# Patient Record
Sex: Female | Born: 1970 | Race: Black or African American | Hispanic: No | Marital: Married | Smoking: Never smoker
Health system: Southern US, Community
[De-identification: ages and names within clinical notes are randomized; demographics above are authoritative.]

## PROBLEM LIST (undated history)

## (undated) DIAGNOSIS — N920 Excessive and frequent menstruation with regular cycle: Secondary | ICD-10-CM

## (undated) HISTORY — DX: Excessive and frequent menstruation with regular cycle: N92.0

---

## 1998-06-07 ENCOUNTER — Other Ambulatory Visit: Admission: RE | Admit: 1998-06-07 | Discharge: 1998-06-07 | Payer: Self-pay | Admitting: Obstetrics and Gynecology

## 1998-10-28 ENCOUNTER — Inpatient Hospital Stay (HOSPITAL_COMMUNITY): Admission: AD | Admit: 1998-10-28 | Discharge: 1998-10-28 | Payer: Self-pay | Admitting: Obstetrics and Gynecology

## 1998-11-27 ENCOUNTER — Inpatient Hospital Stay (HOSPITAL_COMMUNITY): Admission: AD | Admit: 1998-11-27 | Discharge: 1998-11-27 | Payer: Self-pay | Admitting: Obstetrics and Gynecology

## 1998-12-13 ENCOUNTER — Inpatient Hospital Stay (HOSPITAL_COMMUNITY): Admission: AD | Admit: 1998-12-13 | Discharge: 1998-12-15 | Payer: Self-pay | Admitting: Obstetrics and Gynecology

## 1998-12-17 ENCOUNTER — Encounter (HOSPITAL_COMMUNITY): Admission: RE | Admit: 1998-12-17 | Discharge: 1999-03-17 | Payer: Self-pay | Admitting: Obstetrics and Gynecology

## 1999-01-18 ENCOUNTER — Other Ambulatory Visit: Admission: RE | Admit: 1999-01-18 | Discharge: 1999-01-18 | Payer: Self-pay | Admitting: Obstetrics and Gynecology

## 2001-09-07 ENCOUNTER — Other Ambulatory Visit: Admission: RE | Admit: 2001-09-07 | Discharge: 2001-09-07 | Payer: Self-pay | Admitting: Obstetrics & Gynecology

## 2002-03-23 ENCOUNTER — Encounter: Payer: Self-pay | Admitting: Family Medicine

## 2002-03-23 ENCOUNTER — Ambulatory Visit (HOSPITAL_COMMUNITY): Admission: RE | Admit: 2002-03-23 | Discharge: 2002-03-23 | Payer: Self-pay | Admitting: Family Medicine

## 2002-11-30 ENCOUNTER — Other Ambulatory Visit: Admission: RE | Admit: 2002-11-30 | Discharge: 2002-11-30 | Payer: Self-pay | Admitting: Obstetrics & Gynecology

## 2002-12-01 ENCOUNTER — Other Ambulatory Visit: Admission: RE | Admit: 2002-12-01 | Discharge: 2002-12-01 | Payer: Self-pay | Admitting: Obstetrics & Gynecology

## 2003-07-23 ENCOUNTER — Inpatient Hospital Stay (HOSPITAL_COMMUNITY): Admission: AD | Admit: 2003-07-23 | Discharge: 2003-07-26 | Payer: Self-pay | Admitting: Obstetrics & Gynecology

## 2003-07-27 ENCOUNTER — Encounter: Admission: RE | Admit: 2003-07-27 | Discharge: 2003-08-26 | Payer: Self-pay | Admitting: Obstetrics & Gynecology

## 2004-02-29 ENCOUNTER — Ambulatory Visit: Payer: Self-pay | Admitting: Pain Medicine

## 2004-06-25 ENCOUNTER — Ambulatory Visit: Payer: Self-pay | Admitting: Pain Medicine

## 2004-06-28 ENCOUNTER — Ambulatory Visit: Payer: Self-pay | Admitting: Pain Medicine

## 2004-07-12 ENCOUNTER — Ambulatory Visit: Payer: Self-pay | Admitting: Physician Assistant

## 2004-07-26 ENCOUNTER — Ambulatory Visit: Payer: Self-pay | Admitting: Pain Medicine

## 2005-12-10 ENCOUNTER — Inpatient Hospital Stay (HOSPITAL_COMMUNITY): Admission: RE | Admit: 2005-12-10 | Discharge: 2005-12-13 | Payer: Self-pay | Admitting: Orthopedic Surgery

## 2005-12-10 ENCOUNTER — Encounter (INDEPENDENT_AMBULATORY_CARE_PROVIDER_SITE_OTHER): Payer: Self-pay | Admitting: *Deleted

## 2006-01-21 HISTORY — PX: OTHER SURGICAL HISTORY: SHX169

## 2006-10-24 ENCOUNTER — Encounter: Admission: RE | Admit: 2006-10-24 | Discharge: 2006-10-24 | Payer: Self-pay | Admitting: Emergency Medicine

## 2006-10-31 ENCOUNTER — Encounter: Admission: RE | Admit: 2006-10-31 | Discharge: 2006-10-31 | Payer: Self-pay | Admitting: Emergency Medicine

## 2010-06-08 NOTE — Op Note (Signed)
Mallory Green, Mallory Green NO.:  [id number]   MEDICAL RECORD NO.:  [id number]          PATIENT TYPE:  INP   LOCATION:  2550                         FACILITY:  [facility]   PHYSICIAN:  Nelda Severe, MD      DATE OF BIRTH:  July 07, 1970   DATE OF PROCEDURE:  12/10/2005  DATE OF DISCHARGE:                               OPERATIVE REPORT   SURGEON:  Dr. Nelda Severe.   ASSISTANT:  Lianne Cure, PA-C   PREOPERATIVE DIAGNOSIS:  Central disk herniation L5-S1.   POSTOPERATIVE DIAGNOSIS:  Central disk herniation L5-S1.   OPERATIVE PROCEDURE:  1. Anterior disk excision and decompression spinal canal.  2. Anterior interbody fusion L5-S1.  3. Insertion of Synfix cage with BMP.  4. Internal fixation L5-S1.   OPERATIVE NOTE:  The patient was placed under general endotracheal  anesthesia.  Too large bore IVs were established, one either arm.  A  pulse oximeter was on the left great toe.  The arms were folded across  the chest and padded with foam secured with tape.  The abdomen was  prepped with DuraPrep and draped in square fashion.   A left sided transverse abdominal incision in the lower abdomen was  made, judging what the trajectory would be to visualize the L5-S1 disk  space, that is somewhat below the sacral promontory.  Incision was  carried down to the anterior rectus sheath.  The rectus sheath was  incised transversely.  The medial and lateral borders of the rectus were  identified and mobilized.  The rectus was mobilized posteriorly as well.   Through the part of the incision lateral to the rectus, the  retroperitoneum was dissected to expose the common iliac vessels and the  sacral promontory.   Next we placed self-retaining retractors with Brau blades to retract the  common iliac vessels right and left and the bifurcation of the great  vessels proximally.  A Ray-Tec was packed in the presacral area.  There  is a small amount of venous bleeding on the left side,  controlled with  Gelfoam soaked in thrombin.   A rectangular annulotomy was then made with a 15 blade.  We then used  disk elevators to separate the disk from the endplates of L5 above and  S1 below.  Curettes were then used to detach the nucleus laterally.  A  Leksell rongeur was then used to remove approximately 80% of the nucleus  in one piece.   We then curetted back posteriorly using angled curettes.  Distracters  were then placed in the disk and that further curettage carried out  posteriorly.  We released the posterior annulus off the upper border of  S1 from approximately 10 o'clock through 2 o'clock and similarly  released the annulus from the lower border of the L5.  The annular  fibers remove all the way back to the posterior longitudinal ligament.  Several pieces of degenerate nucleus which had become sequestrated  behind the S1 vertebral body were removed.   Then a small footprint Synfix cage, 13.5 mm x 80 degrees trial was  inserted  and a cross-table lateral radiograph taken which showed  satisfactory distraction of the disk space and ability to seat the  trial.   The implant of the same size was chosen.   In the meantime we had opened a small package of Infuse and soaked  collagen sponge with the solution.  Collagen sponge was then packed into  the cage in the three available compartments.   Then using the squid inserter, the Syn cage was inserted into the disk  space and the disk space distracted to accept it.   We then used the special jig to make holes in the vertebral bodies above  and below and locking screws were used to secure the cage to the L5-S1  vertebral bodies, internally fixing the L5-S1 level.   The retractors were then removed sequentially and care taken to observe  for any bleeding and there was none.  The ureter was inspected and there  was no evidence of any damage to it.  There had been no sharp dissection  performed at all subsequent to  incising the anterior rectus sheath,  except for the annulotomy on the L5-S1 disk.   The cross-table lateral radiograph was taken which showed excellent  position of the cage and internal fixation device.  The anterior rectus  sheath was closed using continuous #1 Vicryl suture.  The subcutaneous  layer was closed using interrupted and continuous 2-0 Vicryl suture.  The skin was closed using undyed 3-0 Vicryl in subcuticular fashion  continuously.  The skin edges were reinforced with Steri-Strips.  A  nonadherent antibiotic ointment dressing was applied and secured with  OpSite.   The blood loss estimated at approximately 150 mL.  The patient was  placed in the bed and extubated, was apparently quite slow waking up,  she has just been transferred to recovery room at time of dictation.  In  the recovery room she is able to actively dorsiflex and plantar flex  both feet, ankles.  Her oxygen saturation in the left great toe was 100%  throughout the entire procedure.   There were no intraoperative complications.  Sponge, needle counts  correct.      Nelda Severe, MD  Electronically Signed     MT/MEDQ  D:  12/10/2005  T:  12/10/2005  Job:  [id number]

## 2010-06-08 NOTE — Discharge Summary (Signed)
Mallory Green, NETZLEY NO.:  [id number]   MEDICAL RECORD NO.:  [id number]          PATIENT TYPE:  INP   LOCATION:  3010                         FACILITY:  [facility]   PHYSICIAN:  Nelda Severe, MD      DATE OF BIRTH:  Jan 31, 1970   DATE OF ADMISSION:  12/10/2005  DATE OF DISCHARGE:  12/13/2005                               DISCHARGE SUMMARY   ADMITTING DIAGNOSIS INCLUDES:  L5-S1 lumbar central disk herniation.   DISCHARGE DIAGNOSIS:  Anterior lumbar fusion, L5-S1, by Dr. Nelda Severe.   BRIEF HISTORY OF STAY:  On December 10, 2005, she was taken to the OR by  Dr. Nelda Severe for anterior L5-S1 lumbar fusion.  The patient  tolerated this well.  She was stable after surgery.  Minimal blood loss.  She was admitted to [facility].  She was placed on PCA for pain  control.  Clear-liquid diet until flatulence was passed.  Physical  therapy was ordered for ambulation and mobility.  Postoperative day 1,  she was neurovascularly motor intact distally.  Hemoglobin was 9.2,  electrolytes were stable.  She was afebrile, and vital signs were  stable.  Minimal drainage on the dressing.  No active drainage noted.  Calves were soft.  TED hose were in place as well as SCDs.  Decreased IV  fluids 50 mL/hour at that time.  Postoperative day 2, she did have signs  of an ileus, and she had swelling in bilateral lower extremities.  We  have started her on Lovenox 40 mg subcu once daily, dose per pharmacy.  Still waiting to advance her diet.  No flatulence passed.  She was  afebrile, and vital signs were stable.  Distally neurovascular and motor  intact.  Abdomen soft.  Postoperative day 2, she was neurovascular and  motor intact.  No flatulence passed at this point.  Hemoglobin was  stable.  She was on Lovenox and DVT prophylaxis.  Postoperative day 3,  November 23rd, she did pass flatulence.  She did eat some breakfast this  morning.  IV was discontinued.  PCA was  discontinued.  Foley was  discontinued.  Dressing was changed today.  The incision is also clean,  dry, and intact.  No active drainage.  No abnormal erythema.  Distally,  she is neurovascularly and motor intact.  Calves were soft, nontender to  palpation.   PLAN AT THIS POINT:  She can walk for ambulation as tolerated.  No  dressing is necessary.  She may shower.  She is going to continue with  Lovenox 40 mg subcu once daily for 14 days.  She is going to be given a  prescription for Norco 10/325 for pain control 1 to 2 every 4-6 hours  p.r.n. for pain.  She is also going to follow up with our office in 2  weeks with Dr. Nelda Severe.   DISPOSITION:  Stable.   DIET:  Regular.   DIAGNOSIS:  Status post anterior lumbar fusion at L5-S1.      Lianne Cure, P.A.      Nelda Severe,  MD  Electronically Signed    MC/MEDQ  D:  12/13/2005  T:  12/13/2005  Job:  [id number]

## 2010-06-08 NOTE — H&P (Signed)
NAMEJESSENIA, Green NO.:  [id number]   MEDICAL RECORD NO.:  [id number]          PATIENT TYPE:  INP   LOCATION:  NA                           FACILITY:  [facility]   PHYSICIAN:  Lianne Cure, P.A.  DATE OF BIRTH:  1970/03/30   DATE OF ADMISSION:  DATE OF DISCHARGE:                                HISTORY & PHYSICAL   HISTORY OF PRESENT ILLNESS:  This is a 40 year old female that comes in with  chief complaint of lumbar back pain, centrally located.  She is otherwise  healthy.   ALLERGIES:  She has no known drug allergies.   CURRENT MEDICATIONS:  Not currently taking any prescription medications.   PAST SURGICAL HISTORY:  She does not have a past surgical history.   FAMILY HISTORY:  Includes mother, age 30, decreased secondary to breast  cancer, and father currently living with osteoarthritis.   REVIEW OF SYSTEMS:  She appears to be a well-developed, well-nourished,  normocephalic, well-appearing 40 year old female in no acute distress.  She  reports no fever, no chills, no bowel or bladder incontinence.  No chronic  cough.  No bleeding tendencies.   PHYSICAL EXAMINATION:  VITAL SIGNS:  Temperature is 98.4, pulse 66,  respirations 18, blood pressure 102/70.  HEENT:  Pupils are equal, round, and reactive to light.  NECK:  Supple.  Full range of motion, and nontender to palpation.  CHEST:  Clear to auscultation bilaterally.  No wheezes noted.  HEART:  Regular rate and rhythm.  No murmur noted.  ABDOMEN:  Soft.  Nontender to palpation.  Positive bowel sounds on  auscultation.  EXTREMITIES:  She has pain essentially in her back at the L5-S1 area that  extends into the buttocks and posterior thigh, but it does not go below the  knees.  SKIN:  Intact, clean, and dry.   X-rays shows degenerative disk disease.  MRI shows L5-S1 central disk  herniation.   PLAN:  Anterior L5-S1 lumbar fusion by Dr. Nelda Severe.      Lianne Cure, P.A.     MC/MEDQ  D:   12/09/2005  T:  12/09/2005  Job:  [id number]
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
## 2010-11-08 ENCOUNTER — Other Ambulatory Visit: Payer: Self-pay | Admitting: Occupational Medicine

## 2010-11-08 ENCOUNTER — Ambulatory Visit: Payer: Self-pay

## 2010-11-08 DIAGNOSIS — R7611 Nonspecific reaction to tuberculin skin test without active tuberculosis: Secondary | ICD-10-CM

## 2011-11-29 ENCOUNTER — Encounter: Payer: Self-pay | Admitting: Obstetrics and Gynecology

## 2011-11-29 ENCOUNTER — Ambulatory Visit (INDEPENDENT_AMBULATORY_CARE_PROVIDER_SITE_OTHER): Payer: 59 | Admitting: Obstetrics and Gynecology

## 2011-11-29 VITALS — BP 112/62 | HR 68 | Resp 16 | Ht 66.0 in | Wt 147.0 lb

## 2011-11-29 DIAGNOSIS — Z139 Encounter for screening, unspecified: Secondary | ICD-10-CM

## 2011-11-29 DIAGNOSIS — Z01419 Encounter for gynecological examination (general) (routine) without abnormal findings: Secondary | ICD-10-CM

## 2011-11-29 DIAGNOSIS — N920 Excessive and frequent menstruation with regular cycle: Secondary | ICD-10-CM | POA: Insufficient documentation

## 2011-11-29 DIAGNOSIS — Z124 Encounter for screening for malignant neoplasm of cervix: Secondary | ICD-10-CM

## 2011-11-29 LAB — PROLACTIN: Prolactin: 9.2 ng/mL

## 2011-11-29 LAB — CBC
Hemoglobin: 10.2 g/dL — ABNORMAL LOW (ref 12.0–15.0)
MCH: 26.4 pg (ref 26.0–34.0)
MCHC: 31.8 g/dL (ref 30.0–36.0)
MCV: 83.2 fL (ref 78.0–100.0)
Platelets: 317 10*3/uL (ref 150–400)
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
## 2011-12-02 LAB — PAP IG W/ RFLX HPV ASCU

## 2011-12-04 ENCOUNTER — Telehealth: Payer: Self-pay

## 2011-12-04 NOTE — Telephone Encounter (Signed)
Notified pt of labs. Rec that she take her M.Vit everyday and supplement OTC iron q d for low iron  Of 10.2.  Pt is agreeable. Appts scheduled for 12/31/2011 for u/s and f/u with Dr Su Hilt afterward. Melody Comas A

## 2011-12-04 NOTE — Telephone Encounter (Signed)
LM for pt to cb re: test results. Mallory Green, Jacqueline A  

## 2011-12-30 ENCOUNTER — Other Ambulatory Visit: Payer: Self-pay

## 2011-12-30 DIAGNOSIS — N920 Excessive and frequent menstruation with regular cycle: Secondary | ICD-10-CM

## 2011-12-31 ENCOUNTER — Ambulatory Visit (INDEPENDENT_AMBULATORY_CARE_PROVIDER_SITE_OTHER): Payer: 59

## 2011-12-31 ENCOUNTER — Encounter: Payer: Self-pay | Admitting: Obstetrics and Gynecology

## 2011-12-31 ENCOUNTER — Ambulatory Visit (INDEPENDENT_AMBULATORY_CARE_PROVIDER_SITE_OTHER): Payer: 59 | Admitting: Obstetrics and Gynecology

## 2011-12-31 VITALS — BP 120/74 | Resp 16 | Ht 66.0 in | Wt 145.0 lb

## 2011-12-31 DIAGNOSIS — N920 Excessive and frequent menstruation with regular cycle: Secondary | ICD-10-CM

## 2011-12-31 MED ORDER — TRANEXAMIC ACID 650 MG PO TABS: 1300.0000 mg | ORAL_TABLET | Freq: Three times a day (TID) | ORAL | Status: DC | PRN

## 2011-12-31 NOTE — Progress Notes (Addendum)
Here for f/u u/s and embx  Filed Vitals:   12/31/11 1522  BP: 120/74  Resp: 16   ROS: noncontributory  Pelvic exam:  VULVA: normal appearing vulva with no masses, tenderness or lesions,  VAGINA: normal appearing vagina with normal color and discharge, no lesions, CERVIX: normal appearing cervix without discharge or lesions,   EmBx Performed per protocol Pipelle passed x 3 to 9.5cm  U/S - Ut 7.75 x 5.2 x 6.1cm, nl bil ovaries, nl endometrium A/P Options reviewed Pt wants to try lysteda Rx sent rto for f/u

## 2012-01-01 ENCOUNTER — Other Ambulatory Visit: Payer: Self-pay | Admitting: Obstetrics and Gynecology

## 2012-01-01 DIAGNOSIS — N920 Excessive and frequent menstruation with regular cycle: Secondary | ICD-10-CM

## 2012-12-25 ENCOUNTER — Other Ambulatory Visit: Payer: Self-pay

## 2012-12-25 DIAGNOSIS — Z1231 Encounter for screening mammogram for malignant neoplasm of breast: Secondary | ICD-10-CM

## 2013-02-02 ENCOUNTER — Ambulatory Visit: Payer: Self-pay

## 2013-02-02 ENCOUNTER — Ambulatory Visit
Admission: RE | Admit: 2013-02-02 | Discharge: 2013-02-02 | Disposition: A | Discharge status: Home / Self Care | Payer: 59 | Source: Ambulatory Visit

## 2013-02-02 DIAGNOSIS — Z1231 Encounter for screening mammogram for malignant neoplasm of breast: Secondary | ICD-10-CM

## 2013-02-08 ENCOUNTER — Other Ambulatory Visit: Payer: Self-pay | Admitting: Obstetrics and Gynecology

## 2013-02-08 DIAGNOSIS — R928 Other abnormal and inconclusive findings on diagnostic imaging of breast: Secondary | ICD-10-CM

## 2013-02-12 ENCOUNTER — Other Ambulatory Visit: Payer: Self-pay

## 2013-02-15 ENCOUNTER — Other Ambulatory Visit: Payer: Self-pay | Admitting: Obstetrics and Gynecology

## 2013-02-15 ENCOUNTER — Ambulatory Visit
Admission: RE | Admit: 2013-02-15 | Discharge: 2013-02-15 | Disposition: A | Discharge status: Home / Self Care | Payer: 59 | Source: Ambulatory Visit | Attending: Obstetrics and Gynecology | Admitting: Obstetrics and Gynecology

## 2013-02-15 DIAGNOSIS — R928 Other abnormal and inconclusive findings on diagnostic imaging of breast: Secondary | ICD-10-CM

## 2013-11-22 ENCOUNTER — Encounter: Payer: Self-pay | Admitting: Obstetrics and Gynecology

## 2014-12-26 ENCOUNTER — Other Ambulatory Visit: Payer: Self-pay

## 2014-12-26 DIAGNOSIS — Z1231 Encounter for screening mammogram for malignant neoplasm of breast: Secondary | ICD-10-CM

## 2015-02-06 ENCOUNTER — Ambulatory Visit
Admission: RE | Admit: 2015-02-06 | Discharge: 2015-02-06 | Disposition: A | Discharge status: Home / Self Care | Payer: 59 | Source: Ambulatory Visit

## 2015-02-06 ENCOUNTER — Other Ambulatory Visit: Payer: Self-pay

## 2015-02-06 DIAGNOSIS — Z1231 Encounter for screening mammogram for malignant neoplasm of breast: Secondary | ICD-10-CM

## 2016-08-16 ENCOUNTER — Telehealth: Payer: Self-pay | Admitting: Family Medicine

## 2016-08-16 ENCOUNTER — Ambulatory Visit (INDEPENDENT_AMBULATORY_CARE_PROVIDER_SITE_OTHER): Payer: 59 | Admitting: Family Medicine

## 2016-08-16 ENCOUNTER — Encounter: Payer: Self-pay | Admitting: Family Medicine

## 2016-08-16 ENCOUNTER — Other Ambulatory Visit: Payer: Self-pay | Admitting: Family Medicine

## 2016-08-16 VITALS — BP 108/74 | HR 86 | Temp 98.6°F | Resp 20 | Ht 66.0 in | Wt 145.8 lb

## 2016-08-16 DIAGNOSIS — Z131 Encounter for screening for diabetes mellitus: Secondary | ICD-10-CM

## 2016-08-16 DIAGNOSIS — Z0000 Encounter for general adult medical examination without abnormal findings: Secondary | ICD-10-CM

## 2016-08-16 DIAGNOSIS — Z130 Encounter for screening for diseases of the blood and blood-forming organs and certain disorders involving the immune mechanism: Secondary | ICD-10-CM

## 2016-08-16 DIAGNOSIS — Z1239 Encounter for other screening for malignant neoplasm of breast: Secondary | ICD-10-CM

## 2016-08-16 DIAGNOSIS — Z1329 Encounter for screening for other suspected endocrine disorder: Secondary | ICD-10-CM | POA: Diagnosis not present

## 2016-08-16 DIAGNOSIS — Z1231 Encounter for screening mammogram for malignant neoplasm of breast: Secondary | ICD-10-CM

## 2016-08-16 DIAGNOSIS — N920 Excessive and frequent menstruation with regular cycle: Secondary | ICD-10-CM | POA: Diagnosis not present

## 2016-08-16 DIAGNOSIS — Z13220 Encounter for screening for lipoid disorders: Secondary | ICD-10-CM | POA: Diagnosis not present

## 2016-08-16 LAB — CBC WITH DIFFERENTIAL/PLATELET
BASOS ABS: 0.1 10*3/uL (ref 0.0–0.1)
Basophils Relative: 1.9 % (ref 0.0–3.0)
Eosinophils Absolute: 0 10*3/uL (ref 0.0–0.7)
Eosinophils Relative: 0.8 % (ref 0.0–5.0)
HCT: 32.9 % — ABNORMAL LOW (ref 36.0–46.0)
HEMOGLOBIN: 10.2 g/dL — AB (ref 12.0–15.0)
LYMPHS ABS: 1.4 10*3/uL (ref 0.7–4.0)
LYMPHS PCT: 33.2 % (ref 12.0–46.0)
MCHC: 31.2 g/dL (ref 30.0–36.0)
MCV: 82.9 fl (ref 78.0–100.0)
MONOS PCT: 9.2 % (ref 3.0–12.0)
Monocytes Absolute: 0.4 10*3/uL (ref 0.1–1.0)
NEUTROS PCT: 54.9 % (ref 43.0–77.0)
Neutro Abs: 2.2 10*3/uL (ref 1.4–7.7)
Platelets: 308 10*3/uL (ref 150.0–400.0)
RBC: 3.96 Mil/uL (ref 3.87–5.11)
RDW: 17.5 % — ABNORMAL HIGH (ref 11.5–15.5)
WBC: 4.1 10*3/uL (ref 4.0–10.5)

## 2016-08-16 LAB — LIPID PANEL
CHOL/HDL RATIO: 3
Cholesterol: 159 mg/dL (ref 0–200)
HDL: 49.2 mg/dL (ref >39.00)
LDL Cholesterol: 98 mg/dL (ref 0–99)
NONHDL: 109.65
Triglycerides: 58 mg/dL (ref 0.0–149.0)
VLDL: 11.6 mg/dL (ref 0.0–40.0)

## 2016-08-16 LAB — HEMOGLOBIN A1C: HEMOGLOBIN A1C: 4.9 % (ref 4.6–6.5)

## 2016-08-16 LAB — COMPREHENSIVE METABOLIC PANEL
ALBUMIN: 4.1 g/dL (ref 3.5–5.2)
ALK PHOS: 41 U/L (ref 39–117)
ALT: 12 U/L (ref 0–35)
AST: 19 U/L (ref 0–37)
BILIRUBIN TOTAL: 0.7 mg/dL (ref 0.2–1.2)
BUN: 9 mg/dL (ref 6–23)
CO2: 26 mEq/L (ref 19–32)
Calcium: 9.2 mg/dL (ref 8.4–10.5)
Chloride: 107 mEq/L (ref 96–112)
Creatinine, Ser: 1.01 mg/dL (ref 0.40–1.20)
GFR: 75.86 mL/min (ref >60.00)
GLUCOSE: 93 mg/dL (ref 70–99)
Potassium: 4.7 mEq/L (ref 3.5–5.1)
Sodium: 140 mEq/L (ref 135–145)
TOTAL PROTEIN: 7.6 g/dL (ref 6.0–8.3)

## 2016-08-16 LAB — TSH: TSH: 1.77 u[IU]/mL (ref 0.35–4.50)

## 2016-08-16 NOTE — Patient Instructions (Signed)
Health Maintenance, Female Adopting a healthy lifestyle and getting preventive care can go a long way to promote health and wellness. Talk with your health care provider about what schedule of regular examinations is right for you. This is a good chance for you to check in with your provider about disease prevention and staying healthy. In between checkups, there are plenty of things you can do on your own. Experts have done a lot of research about which lifestyle changes and preventive measures are most likely to keep you healthy. Ask your health care provider for more information. Weight and diet Eat a healthy diet  Be sure to include plenty of vegetables, fruits, low-fat dairy products, and lean protein.  Do not eat a lot of foods high in solid fats, added sugars, or salt.  Get regular exercise. This is one of the most important things you can do for your health. ? Most adults should exercise for at least 150 minutes each week. The exercise should increase your heart rate and make you sweat (moderate-intensity exercise). ? Most adults should also do strengthening exercises at least twice a week. This is in addition to the moderate-intensity exercise.  Maintain a healthy weight  Body mass index (BMI) is a measurement that can be used to identify possible weight problems. It estimates body fat based on height and weight. Your health care provider can help determine your BMI and help you achieve or maintain a healthy weight.  For females 20 years of age and older: ? A BMI below 18.5 is considered underweight. ? A BMI of 18.5 to 24.9 is normal. ? A BMI of 25 to 29.9 is considered overweight. ? A BMI of 30 and above is considered obese.  Watch levels of cholesterol and blood lipids  You should start having your blood tested for lipids and cholesterol at 46 years of age, then have this test every 5 years.  You may need to have your cholesterol levels checked more often if: ? Your lipid or  cholesterol levels are high. ? You are older than 46 years of age. ? You are at high risk for heart disease.  Cancer screening Lung Cancer  Lung cancer screening is recommended for adults 55-80 years old who are at high risk for lung cancer because of a history of smoking.  A yearly low-dose CT scan of the lungs is recommended for people who: ? Currently smoke. ? Have quit within the past 15 years. ? Have at least a 30-pack-year history of smoking. A pack year is smoking an average of one pack of cigarettes a day for 1 year.  Yearly screening should continue until it has been 15 years since you quit.  Yearly screening should stop if you develop a health problem that would prevent you from having lung cancer treatment.  Breast Cancer  Practice breast self-awareness. This means understanding how your breasts normally appear and feel.  It also means doing regular breast self-exams. Let your health care provider know about any changes, no matter how small.  If you are in your 20s or 30s, you should have a clinical breast exam (CBE) by a health care provider every 1-3 years as part of a regular health exam.  If you are 40 or older, have a CBE every year. Also consider having a breast X-ray (mammogram) every year.  If you have a family history of breast cancer, talk to your health care provider about genetic screening.  If you are at high risk   for breast cancer, talk to your health care provider about having an MRI and a mammogram every year.  Breast cancer gene (BRCA) assessment is recommended for women who have family members with BRCA-related cancers. BRCA-related cancers include: ? Breast. ? Ovarian. ? Tubal. ? Peritoneal cancers.  Results of the assessment will determine the need for genetic counseling and BRCA1 and BRCA2 testing.  Cervical Cancer Your health care provider may recommend that you be screened regularly for cancer of the pelvic organs (ovaries, uterus, and  vagina). This screening involves a pelvic examination, including checking for microscopic changes to the surface of your cervix (Pap test). You may be encouraged to have this screening done every 3 years, beginning at age 22.  For women ages 56-65, health care providers may recommend pelvic exams and Pap testing every 3 years, or they may recommend the Pap and pelvic exam, combined with testing for human papilloma virus (HPV), every 5 years. Some types of HPV increase your risk of cervical cancer. Testing for HPV may also be done on women of any age with unclear Pap test results.  Other health care providers may not recommend any screening for nonpregnant women who are considered low risk for pelvic cancer and who do not have symptoms. Ask your health care provider if a screening pelvic exam is right for you.  If you have had past treatment for cervical cancer or a condition that could lead to cancer, you need Pap tests and screening for cancer for at least 20 years after your treatment. If Pap tests have been discontinued, your risk factors (such as having a new sexual partner) need to be reassessed to determine if screening should resume. Some women have medical problems that increase the chance of getting cervical cancer. In these cases, your health care provider may recommend more frequent screening and Pap tests.  Colorectal Cancer  This type of cancer can be detected and often prevented.  Routine colorectal cancer screening usually begins at 46 years of age and continues through 46 years of age.  Your health care provider may recommend screening at an earlier age if you have risk factors for colon cancer.  Your health care provider may also recommend using home test kits to check for hidden blood in the stool.  A small camera at the end of a tube can be used to examine your colon directly (sigmoidoscopy or colonoscopy). This is done to check for the earliest forms of colorectal  cancer.  Routine screening usually begins at age 33.  Direct examination of the colon should be repeated every 5-10 years through 46 years of age. However, you may need to be screened more often if early forms of precancerous polyps or small growths are found.  Skin Cancer  Check your skin from head to toe regularly.  Tell your health care provider about any new moles or changes in moles, especially if there is a change in a mole's shape or color.  Also tell your health care provider if you have a mole that is larger than the size of a pencil eraser.  Always use sunscreen. Apply sunscreen liberally and repeatedly throughout the day.  Protect yourself by wearing long sleeves, pants, a wide-brimmed hat, and sunglasses whenever you are outside.  Heart disease, diabetes, and high blood pressure  High blood pressure causes heart disease and increases the risk of stroke. High blood pressure is more likely to develop in: ? People who have blood pressure in the high end of  the normal range (130-139/85-89 mm Hg). ? People who are overweight or obese. ? People who are African American.  If you are 21-29 years of age, have your blood pressure checked every 3-5 years. If you are 3 years of age or older, have your blood pressure checked every year. You should have your blood pressure measured twice-once when you are at a hospital or clinic, and once when you are not at a hospital or clinic. Record the average of the two measurements. To check your blood pressure when you are not at a hospital or clinic, you can use: ? An automated blood pressure machine at a pharmacy. ? A home blood pressure monitor.  If you are between 17 years and 37 years old, ask your health care provider if you should take aspirin to prevent strokes.  Have regular diabetes screenings. This involves taking a blood sample to check your fasting blood sugar level. ? If you are at a normal weight and have a low risk for diabetes,  have this test once every three years after 46 years of age. ? If you are overweight and have a high risk for diabetes, consider being tested at a younger age or more often. Preventing infection Hepatitis B  If you have a higher risk for hepatitis B, you should be screened for this virus. You are considered at high risk for hepatitis B if: ? You were born in a country where hepatitis B is common. Ask your health care provider which countries are considered high risk. ? Your parents were born in a high-risk country, and you have not been immunized against hepatitis B (hepatitis B vaccine). ? You have HIV or AIDS. ? You use needles to inject street drugs. ? You live with someone who has hepatitis B. ? You have had sex with someone who has hepatitis B. ? You get hemodialysis treatment. ? You take certain medicines for conditions, including cancer, organ transplantation, and autoimmune conditions.  Hepatitis C  Blood testing is recommended for: ? Everyone born from [birth year] through 1965. ? Anyone with known risk factors for hepatitis C.  Sexually transmitted infections (STIs)  You should be screened for sexually transmitted infections (STIs) including gonorrhea and chlamydia if: ? You are sexually active and are younger than 46 years of age. ? You are older than 46 years of age and your health care provider tells you that you are at risk for this type of infection. ? Your sexual activity has changed since you were last screened and you are at an increased risk for chlamydia or gonorrhea. Ask your health care provider if you are at risk.  If you do not have HIV, but are at risk, it may be recommended that you take a prescription medicine daily to prevent HIV infection. This is called pre-exposure prophylaxis (PrEP). You are considered at risk if: ? You are sexually active and do not regularly use condoms or know the HIV status of your partner(s). ? You take drugs by injection. ? You are  sexually active with a partner who has HIV.  Talk with your health care provider about whether you are at high risk of being infected with HIV. If you choose to begin PrEP, you should first be tested for HIV. You should then be tested every 3 months for as long as you are taking PrEP. Pregnancy  If you are premenopausal and you may become pregnant, ask your health care provider about preconception counseling.  If you may become  pregnant, take 400 to 800 micrograms (mcg) of folic acid every day.  If you want to prevent pregnancy, talk to your health care provider about birth control (contraception). Osteoporosis and menopause  Osteoporosis is a disease in which the bones lose minerals and strength with aging. This can result in serious bone fractures. Your risk for osteoporosis can be identified using a bone density scan.  If you are 65 years of age or older, or if you are at risk for osteoporosis and fractures, ask your health care provider if you should be screened.  Ask your health care provider whether you should take a calcium or vitamin D supplement to lower your risk for osteoporosis.  Menopause may have certain physical symptoms and risks.  Hormone replacement therapy may reduce some of these symptoms and risks. Talk to your health care provider about whether hormone replacement therapy is right for you. Follow these instructions at home:  Schedule regular health, dental, and eye exams.  Stay current with your immunizations.  Do not use any tobacco products including cigarettes, chewing tobacco, or electronic cigarettes.  If you are pregnant, do not drink alcohol.  If you are breastfeeding, limit how much and how often you drink alcohol.  Limit alcohol intake to no more than 1 drink per day for nonpregnant women. One drink equals 12 ounces of beer, 5 ounces of wine, or 1 ounces of hard liquor.  Do not use street drugs.  Do not share needles.  Ask your health care  provider for help if you need support or information about quitting drugs.  Tell your health care provider if you often feel depressed.  Tell your health care provider if you have ever been abused or do not feel safe at home. This information is not intended to replace advice given to you by your health care provider. Make sure you discuss any questions you have with your health care provider. Document Released: 07/23/2010 Document Revised: 06/15/2015 Document Reviewed: 10/11/2014 Elsevier Interactive Patient Education  2018 [employer]   Please help us help you:  We are honored you have chosen [facility] for your Primary Care home. Below you will find basic instructions that you may need to access in the future. Please help us help you by reading the instructions, which cover many of the frequent questions we experience.   Prescription refills and request:  -In order to allow more efficient response time, please call your pharmacy for all refills. They will forward the request electronically to us. This allows for the quickest possible response. Request left on a nurse line can take longer to refill, since these are checked as time allows between office patients and other phone calls.  - refill request can take up to 3-5 working days to complete.  - If request is sent electronically and request is appropiate, it is usually completed in 1-2 business days.  - all patients will need to be seen routinely for all chronic medical conditions requiring prescription medications (see follow-up below). If you are overdue for follow up on your condition, you will be asked to make an appointment and we will call in enough medication to cover you until your appointment (up to 30 days).  - all controlled substances will require a face to face visit to request/refill.  - if you desire your prescriptions to go through a new pharmacy, and have an active script at original pharmacy, you will need to call  your pharmacy and have scripts transferred to   new pharmacy. This is completed between the pharmacy locations and not by your provider.    Results: If any images or labs were ordered, it can take up to 1 week to get results depending on the test ordered and the lab/facility running and resulting the test. - Normal or stable results, which do not need further discussion, may be released to your mychart immediately with attached note to you. A call may not be generated for normal results. Please make certain to sign up for mychart. If you have questions on how to activate your mychart you can call the front office.  - If your results need further discussion, our office will attempt to contact you via phone, and if unable to reach you after 2 attempts, we will release your abnormal result to your mychart with instructions.  - All results will be automatically released in mychart after 1 week.  - Your provider will provide you with explanation and instruction on all relevant material in your results. Please keep in mind, results and labs may appear confusing or abnormal to the untrained eye, but it does not mean they are actually abnormal for you personally. If you have any questions about your results that are not covered, or you desire more detailed explanation than what was provided, you should make an appointment with your provider to do so.   Our office handles many outgoing and incoming calls daily. If we have not contacted you within 1 week about your results, please check your mychart to see if there is a message first and if not, then contact our office.  In helping with this matter, you help decrease call volume, and therefore allow us to be able to respond to patients needs more efficiently.   Acute office visits (sick visit):  An acute visit is intended for a new problem and are scheduled in shorter time slots to allow schedule openings for patients with new problems. This is the appropriate visit  to discuss a new problem. In order to provide you with excellent quality medical care with proper time for you to explain your problem, have an exam and receive treatment with instructions, these appointments should be limited to one new problem per visit. If you experience a new problem, in which you desire to be addressed, please make an acute office visit, we save openings on the schedule to accommodate you. Please do not save your new problem for any other type of visit, let us take care of it properly and quickly for you.   Follow up visits:  Depending on your condition(s) your provider will need to see you routinely in order to provide you with quality care and prescribe medication(s). Most chronic conditions (Example: hypertension, Diabetes, depression/anxiety... etc), require visits a couple times a year. Your provider will instruct you on proper follow up for your personal medical conditions and history. Please make certain to make follow up appointments for your condition as instructed. Failing to do so could result in lapse in your medication treatment/refills. If you request a refill, and are overdue to be seen on a condition, we will always provide you with a 30 day script (once) to allow you time to schedule.    Medicare wellness (well visit): - we have a wonderful Nurse (Kim), that will meet with you and provide you will yearly medicare wellness visits. These visits should occur yearly (can not be scheduled less than 1 calendar year apart) and cover preventive health, immunizations, advance directives and   you are entitled to yearly through your medicare benefits. Do not miss out on your entitled benefits, this is when medicare will pay for these benefits to be ordered for you.  These are strongly encouraged by your provider and is the appropriate type of visit to make certain you are up to date with all preventive health benefits. If you have not had your medicare wellness exam in  the last 12 months, please make certain to schedule one by calling the office and schedule your medicare wellness with Maudie Mercury as soon as possible.   Yearly physical (well visit):  - Adults are recommended to be seen yearly for physicals. Check with your insurance and date of your last physical, most insurances require one calendar year between physicals. Physicals include all preventive health topics, screenings, medical exam and labs that are appropriate for gender/age and history. You may have fasting labs needed at this visit. This is a well visit (not a sick visit), new problems should not be covered during this visit (see acute visit).  - Pediatric patients are seen more frequently when they are younger. Your provider will advise you on well child visit timing that is appropriate for your their age. - This is not a medicare wellness visit. Medicare wellness exams do not have an exam portion to the visit. Some medicare companies allow for a physical, some do not allow a yearly physical. If your medicare allows a yearly physical you can schedule the medicare wellness with our nurse Maudie Mercury and have your physical with your provider after, on the same day. Please check with insurance for your full benefits.   Late Policy/No Shows:  - all new patients should arrive 15-30 minutes earlier than appointment to allow [place] time  to  obtain all personal demographics,  insurance information and for you to complete office paperwork. - All established patients should arrive 10-15 minutes earlier than appointment time to update all information and be checked in .  - In our best efforts to run on time, if you are late for your appointment you will be asked to either reschedule or if able, we will work you back into the schedule. There will be a wait time to work you back in the schedule,  depending on availability.  - If you are unable to make it to your appointment as scheduled, please call 24 hours ahead of time to allow [place]  to fill the time slot with someone else who needs to be seen. If you do not cancel your appointment ahead of time, you may be charged a no show fee.

## 2016-08-16 NOTE — Progress Notes (Signed)
Patient ID: Mallory Green, female  DOB: 01-04-1971, 46 y.o.   MRN: [id number] Patient Care Team    Relationship Specialty Notifications Start End  Ma Hillock, DO PCP - General Family Medicine  08/16/16     Chief Complaint  Patient presents with  . Establish Care  . Annual Exam    Subjective:  Mallory Green is a 47 y.o.  female present for new patient establishment. All past medical history, surgical history, allergies, family history, immunizations, medications and social history were obtained and updated in the electronic medical record today. All recent labs, ED visits and hospitalizations within the last year were reviewed.  Well women: pt reports heavy monthly menses, which has been present since she turned 82. She denies vaginal irritation, dizziness, dyspareunia.  She also feels she is having difficulty eating anything with sugar in it and it makes her feel funny. She is wondering if she is becoming a diabetic.   Health maintenance:  Colonoscopy: No FHx, screen at 50.  Mammogram: Breast cancer in [place]. Completed:02/06/2015, birads 1 Cervical cancer screening: last pap: 01/21/2014, Pt reports normal.  Immunizations: tdap 2016, Influenza 2017(encouraged yearly) Infectious disease screening: HIV completed  DEXA: N/A Assistive device: None Oxygen use: None Patient has a Dental home. Hospitalizations/ED visits:none  Depression screen [facility] 2/9 08/16/2016  Decreased Interest 0  Down, Depressed, Hopeless 0  PHQ - 2 Score 0   No flowsheet data found.   Current Exercise Habits: The patient does not participate in regular exercise at present Exercise limited by: None identified Fall Risk  08/16/2016  Falls in the past year? No     Immunization History  Administered Date(s) Administered  . Tdap 01/21/2014    No exam data present  Past Medical History:  Diagnosis Date  . Menorrhagia    No Known Allergies Past Surgical History:  Procedure Laterality Date  .  Disc surgery L4  2008   Family History  Problem Relation Age of Onset  . Hypertension Father   . Diabetes Father   . Diabetes Mother   . Heart disease Mother   . Breast cancer Mother 9  . Breast cancer Sister 40   Social History   Social History  . Marital status: Married    Spouse name: N/A  . Number of children: 2  . Years of education: MSRN   Occupational History  . RN    Social History Main Topics  . Smoking status: Never Smoker  . Smokeless tobacco: Never Used  . Alcohol use Yes  . Drug use: No  . Sexual activity: Yes    Partners: Male    Birth control/ protection: Condom   Other Topics Concern  . 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    Drinks caffeine. Uses herbal remedies. Takes a daily vitamin.   Wears her seatbelt, wears a bicycle helmet, smoke detectors in the home.   Safe in her relationships.   Allergies as of 08/16/2016   No Known Allergies     Medication List    as of 08/16/2016  6:15 PM   You have not been prescribed any medications.     All past medical history, surgical history, allergies, family history, immunizations andmedications were updated in the EMR today and reviewed under the history and medication portions of their EMR.    Recent Results (from the past 2160 hour(s))  CBC w/Diff  Status: Abnormal   Collection Time: 08/16/16  9:31 AM  Result Value Ref Range   WBC 4.1 4.0 - 10.5 K/uL   RBC 3.96 3.87 - 5.11 Mil/uL   Hemoglobin 10.2 (L) 12.0 - 15.0 g/dL   HCT 32.9 (L) 36.0 - 46.0 %   MCV 82.9 78.0 - 100.0 fl   MCHC 31.2 30.0 - 36.0 g/dL   RDW 17.5 (H) 11.5 - 15.5 %   Platelets 308.0 150.0 - 400.0 K/uL   Neutrophils Relative % 54.9 43.0 - 77.0 %   Lymphocytes Relative 33.2 12.0 - 46.0 %   Monocytes Relative 9.2 3.0 - 12.0 %   Eosinophils Relative 0.8 0.0 - 5.0 %   Basophils Relative 1.9 0.0 - 3.0 %   Neutro Abs 2.2 1.4 - 7.7 K/uL   Lymphs Abs 1.4 0.7 - 4.0 K/uL   Monocytes Absolute 0.4  0.1 - 1.0 K/uL   Eosinophils Absolute 0.0 0.0 - 0.7 K/uL   Basophils Absolute 0.1 0.0 - 0.1 K/uL  Comp Met (CMET)     Status: None   Collection Time: 08/16/16  9:31 AM  Result Value Ref Range   Sodium 140 135 - 145 mEq/L   Potassium 4.7 3.5 - 5.1 mEq/L   Chloride 107 96 - 112 mEq/L   CO2 26 19 - 32 mEq/L   Glucose, Bld 93 70 - 99 mg/dL   BUN 9 6 - 23 mg/dL   Creatinine, Ser 1.01 0.40 - 1.20 mg/dL   Total Bilirubin 0.7 0.2 - 1.2 mg/dL   Alkaline Phosphatase 41 39 - 117 U/L   AST 19 0 - 37 U/L   ALT 12 0 - 35 U/L   Total Protein 7.6 6.0 - 8.3 g/dL   Albumin 4.1 3.5 - 5.2 g/dL   Calcium 9.2 8.4 - 10.5 mg/dL   GFR 75.86 >60.00 mL/min  HgB A1c     Status: None   Collection Time: 08/16/16  9:31 AM  Result Value Ref Range   Hgb A1c MFr Bld 4.9 4.6 - 6.5 %    Comment: Glycemic Control Guidelines for People with Diabetes:Non Diabetic:  <6%Goal of Therapy: <7%Additional Action Suggested:  >8%   Lipid panel     Status: None   Collection Time: 08/16/16  9:31 AM  Result Value Ref Range   Cholesterol 159 0 - 200 mg/dL    Comment: ATP III Classification       Desirable:  < 200 mg/dL               Borderline High:  200 - 239 mg/dL          High:  > = 240 mg/dL   Triglycerides 58.0 0.0 - 149.0 mg/dL    Comment: Normal:  <150 mg/dLBorderline High:  150 - 199 mg/dL   HDL 49.20 >39.00 mg/dL   VLDL 11.6 0.0 - 40.0 mg/dL   LDL Cholesterol 98 0 - 99 mg/dL   Total CHOL/HDL Ratio 3     Comment:                Men          Women1/2 Average Risk     3.4          3.3Average Risk          5.0          4.42X Average Risk          9.6  7.13X Average Risk          15.0          11.0                       NonHDL 109.65     Comment: NOTE:  Non-HDL goal should be 30 mg/dL higher than patient's LDL goal (i.e. LDL goal of < 70 mg/dL, would have non-HDL goal of < 100 mg/dL)  TSH     Status: None   Collection Time: 08/16/16  9:31 AM  Result Value Ref Range   TSH 1.77 0.35 - 4.50 uIU/mL    Mm Digital  Screening Bilateral  Result Date: 02/06/2015 CLINICAL DATA:  Screening. EXAM: DIGITAL SCREENING BILATERAL MAMMOGRAM WITH CAD COMPARISON:  Previous exam(s). ACR Breast Density Category c: The breast tissue is heterogeneously dense, which may obscure small masses. FINDINGS: There are no findings suspicious for malignancy. Images were processed with CAD. IMPRESSION: No mammographic evidence of malignancy. A result letter of this screening mammogram will be mailed directly to the patient. RECOMMENDATION: Screening mammogram in one year. (Code:SM-B-01Y) BI-RADS CATEGORY  1: Negative. Electronically Signed   By: Curlene Dolphin M.D.   On: 02/06/2015 15:25     ROS: 14 pt review of systems performed and negative (unless mentioned in an HPI)  Objective: BP 108/74 (BP Location: Right Arm, Patient Position: Sitting, Cuff Size: Normal)   Pulse 86   Temp 98.6 F (37 C)   Resp 20   Ht 5' 6"  (1.676 m)   Wt 145 lb 12 oz (66.1 kg)   LMP 07/05/2016   SpO2 100%   BMI 23.52 kg/m  Gen: Afebrile. No acute distress. Nontoxic in appearance, well-developed, well-nourished,  pleasant African-American female. HENT: AT. [place]. Bilateral TM visualized and normal in appearance, normal external auditory canal. MMM, no oral lesions, adequate dentition. Bilateral nares within normal limits. Throat without erythema, ulcerations or exudates. No Cough on exam, no hoarseness on exam. Eyes:Pupils Equal Round Reactive to light, Extraocular movements intact,  Conjunctiva without redness, discharge or icterus. Neck/lymp/endocrine: Supple, no lymphadenopathy, no thyromegaly CV: RRR no murmur, no edema, +2/4 P posterior tibialis pulses. No carotid bruits. No JVD. Chest: CTAB, no wheeze, rhonchi or crackles. Normal Respiratory effort. Good Air movement. Abd: Soft. Flat. NTND. BS present. No Masses palpated. No hepatosplenomegaly. No rebound tenderness or guarding. Skin: No rashes, purpura or petechiae. Warm and well-perfused. Skin  intact. Neuro/Msk:  Normal gait. PERLA. EOMi. Alert. Oriented x3.  Cranial nerves II through XII intact. Muscle strength 5/5 upper/lower extremity. DTRs equal bilaterally. Psych: Normal affect, dress and demeanor. Normal speech. Normal thought content and judgment.   Assessment/plan: Mallory Green is a 46 y.o. female present for establishment with CPE. Screening for iron deficiency anemia/menorrhagia - CBC w/Diff - TSH Screening for diabetes mellitus - HgB A1c Encounter for preventive health examination Patient was encouraged to exercise greater than 150 minutes a week. Patient was encouraged to choose a diet filled with fresh fruits and vegetables, and lean meats. AVS provided to patient today for education/recommendation on gender specific health and safety maintenance. - Comp Met (CMET) Patient encouraged to schedule her Pap smear, likely will be due either at the end of this year or next year. Mammogram ordered for her today at the breast center. All other immunizations and screenings are up-to-date if desired. Lipid screening - Lipid panel   Return in about 1 year (around 08/16/2017) for CPE.   Note is dictated  utilizing voice recognition software. Although note has been proof read prior to signing, occasional typographical errors still can be missed. If any questions arise, please do not hesitate to call for verification.  Electronically signed by: Howard Pouch, DO [place]

## 2016-08-16 NOTE — Telephone Encounter (Signed)
Please call pt: - her labs all look good, with the exception of anemia. 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 - She is new to me, so I am not certain if she has had any work up in the past concerning her anemia or if it was presumed it was from her heavy menses.  - I would suggest we consider further work up if she has not had one prior. If that is something she is interested in please have her make an appt to discuss and collect labs.  - In the meantime, I would recommend she at least take an OTC iron supplement, since her heavy periods are more than likely the cause. And discuss with her GYN, since there are treatment options to help with heavy menses since she is not planning on having additional children.

## 2016-08-19 NOTE — Telephone Encounter (Signed)
Call attempted, not able to leave message due to voice mail not set up.  Will try to call back at later time.

## 2016-08-19 NOTE — Telephone Encounter (Signed)
Unable to speak to patient, voice mail not set up, will try to call back at later time.

## 2016-08-20 ENCOUNTER — Ambulatory Visit
Admission: RE | Admit: 2016-08-20 | Discharge: 2016-08-20 | Disposition: A | Discharge status: Home / Self Care | Payer: 59 | Source: Ambulatory Visit | Attending: Family Medicine | Admitting: Family Medicine

## 2016-08-20 DIAGNOSIS — Z1231 Encounter for screening mammogram for malignant neoplasm of breast: Secondary | ICD-10-CM | POA: Diagnosis not present

## 2016-08-20 NOTE — Telephone Encounter (Signed)
Patient notified and verbalized understanding. Patient stated that she will make an appointment with GYN and call back and follow up with Dr. Claiborne BillingsKuneff.

## 2016-08-21 ENCOUNTER — Ambulatory Visit: Payer: 59 | Admitting: Family Medicine

## 2017-06-29 DIAGNOSIS — J209 Acute bronchitis, unspecified: Secondary | ICD-10-CM | POA: Diagnosis not present

## 2017-08-18 ENCOUNTER — Encounter: Payer: Self-pay | Admitting: Family Medicine

## 2017-08-18 ENCOUNTER — Ambulatory Visit (INDEPENDENT_AMBULATORY_CARE_PROVIDER_SITE_OTHER): Payer: 59 | Admitting: Family Medicine

## 2017-08-18 VITALS — BP 98/70 | HR 81 | Temp 97.4°F | Resp 20 | Ht 66.0 in | Wt 150.0 lb

## 2017-08-18 DIAGNOSIS — N920 Excessive and frequent menstruation with regular cycle: Secondary | ICD-10-CM | POA: Diagnosis not present

## 2017-08-18 DIAGNOSIS — D649 Anemia, unspecified: Secondary | ICD-10-CM

## 2017-08-18 DIAGNOSIS — Z0000 Encounter for general adult medical examination without abnormal findings: Secondary | ICD-10-CM | POA: Diagnosis not present

## 2017-08-18 DIAGNOSIS — Z131 Encounter for screening for diabetes mellitus: Secondary | ICD-10-CM | POA: Diagnosis not present

## 2017-08-18 DIAGNOSIS — Z1239 Encounter for other screening for malignant neoplasm of breast: Secondary | ICD-10-CM

## 2017-08-18 DIAGNOSIS — Z13220 Encounter for screening for lipoid disorders: Secondary | ICD-10-CM

## 2017-08-18 DIAGNOSIS — Z1231 Encounter for screening mammogram for malignant neoplasm of breast: Secondary | ICD-10-CM

## 2017-08-18 LAB — LIPID PANEL
CHOLESTEROL: 166 mg/dL (ref 0–200)
HDL: 54.5 mg/dL (ref >39.00)
LDL CALC: 102 mg/dL — AB (ref 0–99)
NonHDL: 111.39
Total CHOL/HDL Ratio: 3
Triglycerides: 47 mg/dL (ref 0.0–149.0)
VLDL: 9.4 mg/dL (ref 0.0–40.0)

## 2017-08-18 LAB — COMPREHENSIVE METABOLIC PANEL
ALBUMIN: 4.1 g/dL (ref 3.5–5.2)
ALK PHOS: 37 U/L — AB (ref 39–117)
ALT: 12 U/L (ref 0–35)
AST: 18 U/L (ref 0–37)
BUN: 13 mg/dL (ref 6–23)
CHLORIDE: 106 meq/L (ref 96–112)
CO2: 26 mEq/L (ref 19–32)
Calcium: 9.1 mg/dL (ref 8.4–10.5)
Creatinine, Ser: 0.9 mg/dL (ref 0.40–1.20)
GFR: 86.27 mL/min (ref >60.00)
Glucose, Bld: 86 mg/dL (ref 70–99)
POTASSIUM: 4.4 meq/L (ref 3.5–5.1)
SODIUM: 139 meq/L (ref 135–145)
TOTAL PROTEIN: 7.6 g/dL (ref 6.0–8.3)
Total Bilirubin: 0.5 mg/dL (ref 0.2–1.2)

## 2017-08-18 LAB — CBC WITH DIFFERENTIAL/PLATELET
BASOS PCT: 1 % (ref 0.0–3.0)
Basophils Absolute: 0 10*3/uL (ref 0.0–0.1)
EOS PCT: 0.4 % (ref 0.0–5.0)
Eosinophils Absolute: 0 10*3/uL (ref 0.0–0.7)
HCT: 31.9 % — ABNORMAL LOW (ref 36.0–46.0)
HEMOGLOBIN: 10 g/dL — AB (ref 12.0–15.0)
LYMPHS ABS: 1.2 10*3/uL (ref 0.7–4.0)
Lymphocytes Relative: 23.9 % (ref 12.0–46.0)
MCHC: 31.3 g/dL (ref 30.0–36.0)
MCV: 78.8 fl (ref 78.0–100.0)
MONO ABS: 0.5 10*3/uL (ref 0.1–1.0)
MONOS PCT: 9.4 % (ref 3.0–12.0)
NEUTROS PCT: 65.3 % (ref 43.0–77.0)
Neutro Abs: 3.3 10*3/uL (ref 1.4–7.7)
Platelets: 305 10*3/uL (ref 150.0–400.0)
RBC: 4.05 Mil/uL (ref 3.87–5.11)
RDW: 19.3 % — AB (ref 11.5–15.5)
WBC: 5 10*3/uL (ref 4.0–10.5)

## 2017-08-18 LAB — TSH: TSH: 1.2 u[IU]/mL (ref 0.35–4.50)

## 2017-08-18 LAB — HEMOGLOBIN A1C: Hgb A1c MFr Bld: 5.1 % (ref 4.6–6.5)

## 2017-08-18 NOTE — Progress Notes (Signed)
Patient ID: Mallory Green, female  DOB: 1970-07-12, 47 y.o.   MRN: [id number] Patient Care Team    Relationship Specialty Notifications Start End  Ma Hillock, DO PCP - General Family Medicine  08/16/16     Chief Complaint  Patient presents with  . Annual Exam    Subjective:  Mallory Green is a 47 y.o.  Female  present for CPE. All past medical history, surgical history, allergies, family history, immunizations, medications and social history were updated in the electronic medical record today. All recent labs, ED visits and hospitalizations within the last year were reviewed. Patient's last menstrual period was 07/28/2017.  Health maintenance:  Colonoscopy: no fhx, screen at 25 Mammogram: completed:08/20/2016- has one scheduled at breast center Cervical cancer screening: DUE has gyn appt. , Immunizations: tdap 2016, Influenza UTD (encouraged yearly) Infectious disease screening: HIV completed DEXA: N/A Assistive device: done  Oxygen DTO:IZTI Patient has a Dental home. Hospitalizations/ED visits: reviewed  Depression screen [facility] 2/9 08/18/2017 08/16/2016  Decreased Interest 0 0  Down, Depressed, Hopeless 0 0  PHQ - 2 Score 0 0   No flowsheet data found.   Current Exercise Habits: The patient does not participate in regular exercise at present Exercise limited by: None identified   Immunization History  Administered Date(s) Administered  . Tdap 01/21/2014     Past Medical History:  Diagnosis Date  . Menorrhagia    No Known Allergies Past Surgical History:  Procedure Laterality Date  . Disc surgery L4  2008   Family History  Problem Relation Age of Onset  . Hypertension Father   . Diabetes Father   . Diabetes Mother   . Heart disease Mother   . Breast cancer Mother 58  . Breast cancer Sister 16   Social History   Socioeconomic History  . Marital status: Married    Spouse name: Not on file  . Number of children: 2  . Years of education: MSRN    . Highest education level: Not on file  Occupational History  . Occupation: Therapist, sports  Social Needs  . Financial resource strain: Not on file  . Food insecurity:    Worry: Not on file    Inability: Not on file  . Transportation needs:    Medical: Not on file    Non-medical: Not on file  Tobacco Use  . Smoking status: Never Smoker  . Smokeless tobacco: Never Used  Substance and Sexual Activity  . Alcohol use: Yes  . Drug use: No  . Sexual activity: Yes    Partners: Male    Birth control/protection: Condom  Lifestyle  . Physical activity:    Days per week: Not on file    Minutes per session: Not on file  . Stress: Not on file  Relationships  . Social connections:    Talks on phone: Not on file    Gets together: Not on file    Attends religious service: Not on file    Active member of club or organization: Not on file    Attends meetings of clubs or organizations: Not on file    Relationship status: Not on file  . Intimate partner violence:    Fear of current or ex partner: Not on file    Emotionally abused: Not on file    Physically abused: Not on file    Forced sexual activity: Not on file  Other Topics Concern  . 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    Drinks caffeine. Uses herbal remedies. Takes a daily vitamin.   Wears her seatbelt, wears a bicycle helmet, smoke detectors in the home.   Safe in her relationships.   Allergies as of 08/18/2017   No Known Allergies     Medication List    as of 08/18/2017 11:13 AM   You have not been prescribed any medications.     All past medical history, surgical history, allergies, family history, immunizations andmedications were updated in the EMR today and reviewed under the history and medication portions of their EMR.     No results found for this or any previous visit (from the past 2160 hour(s)).  Mm Screening Breast Tomo Bilateral  Result Date: 08/20/2016 CLINICAL DATA:  Screening.  EXAM: 2D DIGITAL SCREENING BILATERAL MAMMOGRAM WITH CAD AND ADJUNCT TOMO COMPARISON:  Previous exam(s). ACR Breast Density Category c: The breast tissue is heterogeneously dense, which may obscure small masses. FINDINGS: There are no findings suspicious for malignancy. Images were processed with CAD. IMPRESSION: No mammographic evidence of malignancy. A result letter of this screening mammogram will be mailed directly to the patient. RECOMMENDATION: Screening mammogram in one year. (Code:SM-B-01Y) BI-RADS CATEGORY  1: Negative. Electronically Signed   By: Lillia Mountain M.D.   On: 08/20/2016 14:40     ROS: 14 pt review of systems performed and negative (unless mentioned in an HPI)  Objective: BP 98/70 (BP Location: Left Arm, Patient Position: Sitting, Cuff Size: Normal)   Pulse 81   Temp (!) 97.4 F (36.3 C)   Resp 20   Ht 5' 6"  (1.676 m)   Wt 150 lb (68 kg)   LMP 07/28/2017   SpO2 98%   BMI 24.21 kg/m  Gen: Afebrile. No acute distress. Nontoxic in appearance, well-developed, well-nourished,  Pleasant AAF.  HENT: AT. [place]. Bilateral TM visualized and normal in appearance, normal external auditory canal. MMM, no oral lesions, adequate dentition. Bilateral nares within normal limits. Throat without erythema, ulcerations or exudates. no Cough on exam, no hoarseness on exam. Eyes:Pupils Equal Round Reactive to light, Extraocular movements intact,  Conjunctiva without redness, discharge or icterus. Neck/lymp/endocrine: Supple,no lymphadenopathy, no thyromegaly CV: RRR no murmur, no edema, +2/4 P posterior tibialis pulses. no carotid bruits. No JVD. Chest: CTAB, no wheeze, rhonchi or crackles. nomral Respiratory effort. good Air movement. Abd: Soft. no. NTND. BS active. no Masses palpated. No hepatosplenomegaly. No rebound tenderness or guarding. Skin: no rashes, purpura or petechiae. Warm and well-perfused. Skin intact. Neuro/Msk:  Normal gait. PERLA. EOMi. Alert. Oriented x3.  Cranial nerves II  through XII intact. Muscle strength 5/5 upper/lower extremity. DTRs equal bilaterally. Psych: Normal affect, dress and demeanor. Normal speech. Normal thought content and judgment.   No exam data present  Assessment/plan: Mallory Green is a 47 y.o. female present for CPE. Screening for diabetes mellitus - HgB A1c Lipid screening - Lipid panel Anemia, unspecified type/Menorrhagia with regular cycle - has gyn - CBC w/Diff - Comp Met (CMET) - TSH Breast cancer screening Has appt in August Encounter for preventive health examination Patient was encouraged to exercise greater than 150 minutes a week. Patient was encouraged to choose a diet filled with fresh fruits and vegetables, and lean meats. AVS provided to patient today for education/recommendation on gender specific health and safety maintenance. Colonoscopy: no fhx, screen at 50 Mammogram: completed:08/20/2016- has one scheduled at breast center Cervical cancer screening: DUE has gyn appt. , Immunizations: tdap 2016, Influenza  UTD (encouraged yearly) Infectious disease screening: HIV completed DEXA: N/A   Return in about 1 year (around 08/19/2018) for CPE.  Electronically signed by: Howard Pouch, DO [place]

## 2017-08-18 NOTE — Patient Instructions (Signed)

## 2017-12-23 ENCOUNTER — Other Ambulatory Visit: Payer: Self-pay | Admitting: Family Medicine

## 2017-12-23 ENCOUNTER — Ambulatory Visit
Admission: RE | Admit: 2017-12-23 | Discharge: 2017-12-23 | Disposition: A | Discharge status: Home / Self Care | Payer: 59 | Source: Ambulatory Visit | Attending: Family Medicine | Admitting: Family Medicine

## 2017-12-23 DIAGNOSIS — Z1231 Encounter for screening mammogram for malignant neoplasm of breast: Secondary | ICD-10-CM | POA: Diagnosis not present

## 2017-12-24 NOTE — Progress Notes (Signed)
Phone call attempted, unable to leave message will try to call back at later time.

## 2018-03-12 DIAGNOSIS — N920 Excessive and frequent menstruation with regular cycle: Secondary | ICD-10-CM | POA: Diagnosis not present

## 2018-03-12 DIAGNOSIS — Z6825 Body mass index (BMI) 25.0-25.9, adult: Secondary | ICD-10-CM | POA: Diagnosis not present

## 2018-03-12 DIAGNOSIS — Z01419 Encounter for gynecological examination (general) (routine) without abnormal findings: Secondary | ICD-10-CM | POA: Diagnosis not present

## 2018-08-21 ENCOUNTER — Encounter: Payer: 59 | Admitting: Family Medicine

## 2018-12-29 ENCOUNTER — Ambulatory Visit: Payer: 59 | Admitting: Medical

## 2018-12-29 ENCOUNTER — Other Ambulatory Visit: Payer: Self-pay

## 2018-12-29 ENCOUNTER — Telehealth: Payer: Self-pay | Admitting: Family Medicine

## 2018-12-29 ENCOUNTER — Encounter: Payer: Self-pay | Admitting: Medical

## 2018-12-29 VITALS — BP 112/64 | HR 74 | Temp 97.6°F | Resp 12 | Ht 66.0 in | Wt 158.9 lb

## 2018-12-29 DIAGNOSIS — R300 Dysuria: Secondary | ICD-10-CM | POA: Diagnosis not present

## 2018-12-29 DIAGNOSIS — R319 Hematuria, unspecified: Secondary | ICD-10-CM | POA: Diagnosis not present

## 2018-12-29 DIAGNOSIS — N390 Urinary tract infection, site not specified: Secondary | ICD-10-CM

## 2018-12-29 LAB — POC URINALSYSI DIPSTICK (AUTOMATED)
Bilirubin, UA: NEGATIVE
Glucose, UA: NEGATIVE
Ketones, UA: NEGATIVE
Nitrite, UA: POSITIVE
Protein, UA: NEGATIVE
Spec Grav, UA: <=1.005 — AB (ref 1.010–1.025)
Urobilinogen, UA: 1 E.U./dL
pH, UA: 6 (ref 5.0–8.0)

## 2018-12-29 MED ORDER — NITROFURANTOIN MONOHYD MACRO 100 MG PO CAPS: 100.0000 mg | ORAL_CAPSULE | Freq: Two times a day (BID) | ORAL | 0 refills | Status: DC

## 2018-12-29 NOTE — Telephone Encounter (Signed)
Patient is having pain with urination and frequency. Patient is asking if an Rx can be sent in or can she get an appt this afternoon?

## 2018-12-29 NOTE — Patient Instructions (Signed)
You appear to have a urinary tract infection. I am prescribing macrobid antibiotic for the probable infection. Hydrate well. I am sending out a urine culture. During the interim if your signs and symptoms worsen rather than improving please notify us. We will notify your when the culture results are back.  Follow up in 7 days or as needed. 

## 2018-12-29 NOTE — Telephone Encounter (Signed)
Pt was called and told abx could not called in. Dr Raoul Pitch booked, scheduled with [facility] provider

## 2018-12-29 NOTE — Progress Notes (Signed)
Subjective:    Patient ID: Mallory Green, female    DOB: 05-09-1970, 48 y.o.   MRN: [id number]  HPI    Pt in today reporting urinary symptoms since sunday  Dysuria- 2 days Frequent and urgent urination-yes Hesitancy-no Suprapubic pressure-yes Fever-no chills-no Nausea-no Vomiting-no CVA pain-no History of UTI-no Gross hematuria-thinks maybe on Sunday but not sure.  LMP- 2 weeks ago.  Review of Systems  Constitutional: Negative for chills, fatigue and fever.  Respiratory: Negative for cough, chest tightness and wheezing.   Cardiovascular: Negative for chest pain and palpitations.  Gastrointestinal: Negative for abdominal pain.  Genitourinary: Positive for dysuria, frequency and urgency. Negative for decreased urine volume, vaginal discharge and vaginal pain.  Musculoskeletal: Negative for back pain.  Skin: Negative for rash.  Neurological: Negative for dizziness and headaches.  Psychiatric/Behavioral: Negative for behavioral problems.    Past Medical History:  Diagnosis Date  . Menorrhagia      Social History   Socioeconomic History  . Marital status: Married    Spouse name: Not on file  . Number of children: 2  . Years of education: MSRN  . Highest education level: Not on file  Occupational History  . Occupation: Therapist, sports  Social Needs  . Financial resource strain: Not on file  . Food insecurity    Worry: Not on file    Inability: Not on file  . Transportation needs    Medical: Not on file    Non-medical: Not on file  Tobacco Use  . Smoking status: Never Smoker  . Smokeless tobacco: Never Used  Substance and Sexual Activity  . Alcohol use: Yes  . Drug use: No  . Sexual activity: Yes    Partners: Male    Birth control/protection: Condom  Lifestyle  . Physical activity    Days per week: Not on file    Minutes per session: Not on file  . Stress: Not on file  Relationships  . Social Herbalist on phone: Not on file    Gets together: Not on  file    Attends religious service: Not on file    Active member of club or organization: Not on file    Attends meetings of clubs or organizations: Not on file    Relationship status: Not on file  . Intimate partner violence    Fear of current or ex partner: Not on file    Emotionally abused: Not on file    Physically abused: Not on file    Forced sexual activity: Not on file  Other Topics Concern  . 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    Drinks caffeine. Uses herbal remedies. Takes a daily vitamin.   Wears her seatbelt, wears a bicycle helmet, smoke detectors in the home.   Safe in her relationships.    Past Surgical History:  Procedure Laterality Date  . Disc surgery L4  2008    Family History  Problem Relation Age of Onset  . Hypertension Father   . Diabetes Father   . Diabetes Mother   . Heart disease Mother   . Breast cancer Mother 34  . Breast cancer Sister 19    No Known Allergies  No current outpatient medications on file prior to visit.   No current facility-administered medications on file prior to visit.     BP 112/64 (BP Location: Left Arm, Cuff Size: Normal)   Pulse 74  Temp 97.6 F (36.4 C) (Temporal)   Resp 12   Ht 5\' 6"  (1.676 m)   Wt 158 lb 14.4 oz (72.1 kg)   LMP 12/19/2018   SpO2 100%   BMI 25.65 kg/m       Objective:   Physical Exam  General Mental Status- Alert. General Appearance- Not in acute distress.   Chest and Lung Exam Auscultation: Breath Sounds:-Normal.  Cardiovascular Auscultation:Rythm- Regular. Murmurs & Other Heart Sounds:Auscultation of the heart reveals- No Murmurs.  Abdomen Inspection:-Inspeection Normal. Palpation/Percussion:Note:No mass. Palpation and Percussion of the abdomen reveal- moderate suprapubic  Tenderness, Non Distended + BS, no rebound or guarding.  Neurologic Cranial Nerve exam:- CN III-XII intact(No nystagmus), symmetric smile.  Back- no cva tenderness.       Assessment & Plan:  You appear to have a urinary tract infection. I am prescribing macrobid  antibiotic for the probable infection. Hydrate well. I am sending out a urine culture. During the interim if your signs and symptoms worsen rather than improving please notify 12/21/2018. We will notify your when the culture results are back.  Follow up in 7 days or as needed.  [place], PA-C

## 2018-12-30 ENCOUNTER — Encounter: Payer: Self-pay | Admitting: Family Medicine

## 2018-12-30 ENCOUNTER — Ambulatory Visit (INDEPENDENT_AMBULATORY_CARE_PROVIDER_SITE_OTHER): Payer: 59 | Admitting: Family Medicine

## 2018-12-30 VITALS — BP 113/76 | HR 77 | Temp 97.9°F | Resp 16 | Ht 66.0 in | Wt 158.5 lb

## 2018-12-30 DIAGNOSIS — Z1231 Encounter for screening mammogram for malignant neoplasm of breast: Secondary | ICD-10-CM

## 2018-12-30 DIAGNOSIS — Z0000 Encounter for general adult medical examination without abnormal findings: Secondary | ICD-10-CM

## 2018-12-30 DIAGNOSIS — Z131 Encounter for screening for diabetes mellitus: Secondary | ICD-10-CM | POA: Diagnosis not present

## 2018-12-30 DIAGNOSIS — Z1211 Encounter for screening for malignant neoplasm of colon: Secondary | ICD-10-CM

## 2018-12-30 DIAGNOSIS — Z13220 Encounter for screening for lipoid disorders: Secondary | ICD-10-CM | POA: Diagnosis not present

## 2018-12-30 DIAGNOSIS — R232 Flushing: Secondary | ICD-10-CM

## 2018-12-30 DIAGNOSIS — N920 Excessive and frequent menstruation with regular cycle: Secondary | ICD-10-CM

## 2018-12-30 NOTE — Progress Notes (Signed)
This visit occurred during the SARS-CoV-2 public health emergency.  Safety protocols were in place, including screening questions prior to the visit, additional usage of staff PPE, and extensive cleaning of exam room while observing appropriate contact time as indicated for disinfecting solutions.    Patient ID: Mallory Green, female  DOB: [birth year], 48 y.o.   MRN: [id number] Patient Care Team    Relationship Specialty Notifications Start End  Ma Hillock, DO PCP - General Family Medicine  08/16/16   Servando Salina, MD Consulting Physician Obstetrics and Gynecology  12/30/18     Chief Complaint  Patient presents with  . Annual Exam    Not fasting. 12/23/2017 Mammogram, Pap smear 2020- Dr Garwin Brothers     Subjective:  Mallory Green is a 48 y.o.  Female  present for CPE. All past medical history, surgical history, allergies, family history, immunizations, medications and social history were updated in the electronic medical record today. All recent labs, ED visits and hospitalizations within the last year were reviewed. Of note she saw a another provider yesterday for UTI symptoms and started on Macrobid.  She does see improvement already with the start of antibiotic. Patient has noted hot flashes that started few months ago.  She states the occur at night and sometimes during the day.  She denies any changes in her menstrual cycle.  She denies any vaginal dryness.  Health maintenance:  Colonoscopy: no fhx, screen at 89. Mammogram: completed: 12/2017. Breast center. Ordered by [employer].  Cervical cancer screening: 2020- Dr.Cousins GYN  Immunizations: tdap 2016, Influenza UTD 2020 (encouraged yearly) Infectious disease screening: HIV completed DEXA: N/A Assistive device: none Oxygen use: none Patient has a Dental home. Hospitalizations/ED visits: reviewed  Depression screen [facility] 2/9 12/30/2018 08/18/2017 08/16/2016  Decreased Interest 0 0 0  Down, Depressed, Hopeless 0 0 0  PHQ - 2  Score 0 0 0   No flowsheet data found.   Immunization History  Administered Date(s) Administered  . Tdap 01/21/2014    Past Medical History:  Diagnosis Date  . Menorrhagia    No Known Allergies Past Surgical History:  Procedure Laterality Date  . Disc surgery L4  2008   Family History  Problem Relation Age of Onset  . Hypertension Father   . Diabetes Father   . Diabetes Mother   . Heart disease Mother   . Breast cancer Mother 89  . 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    Drinks caffeine. Uses herbal remedies. Takes a daily vitamin.   Wears her seatbelt, wears a bicycle helmet, smoke detectors in the home.   Safe in her relationships.    Allergies as of 12/30/2018   No Known Allergies     Medication List       Accurate as of December 30, 2018 11:59 PM. If you have any questions, ask your nurse or doctor.        MULTIVITAMIN ADULT PO Take by mouth.   nitrofurantoin (macrocrystal-monohydrate) 100 MG capsule Commonly known as: Macrobid Take 1 capsule (100 mg total) by mouth 2 (two) times daily.   vitamin C 100 MG tablet Take 100 mg by mouth daily.   Vitamin D (Cholecalciferol) 25 MCG (1000 UT) Caps Take by mouth.   Zinc 25 MG Tabs Take by mouth.       All past medical history, surgical history, allergies, family history, immunizations andmedications were updated  in the EMR today and reviewed under the history and medication portions of their EMR.      Mm 3d Screen Breast Bilateral  Result Date: 12/23/2017 CLINICAL DATA:  Screening. EXAM: DIGITAL SCREENING BILATERAL MAMMOGRAM WITH TOMO AND CAD COMPARISON:  Previous exam(s). ACR Breast Density Category c: The breast tissue is heterogeneously dense, which may obscure small masses. FINDINGS: There are no findings suspicious for malignancy. Images were processed with CAD. IMPRESSION: No mammographic evidence of malignancy. A result letter of  this screening mammogram will be mailed directly to the patient. RECOMMENDATION: Screening mammogram in one year. (Code:SM-B-01Y) BI-RADS CATEGORY  1: Negative. Electronically Signed   By: Kristopher Oppenheim M.D.   On: 12/23/2017 15:01     ROS: 14 pt review of systems performed and negative (unless mentioned in an HPI)  Objective: BP 113/76 (BP Location: Right Arm, Patient Position: Sitting, Cuff Size: Normal)   Pulse 77   Temp 97.9 F (36.6 C) (Temporal)   Resp 16   Ht _0  (1.676 m)   Wt 158 lb 8 oz (71.9 kg)   LMP 12/19/2018   SpO2 98%   BMI 25.58 kg/m  Gen: Afebrile. No acute distress. Nontoxic in appearance, well-developed, well-nourished, very pleasant, African-American female. HENT: AT. [place]. Bilateral TM visualized and normal in appearance, normal external auditory canal. MMM, no oral lesions, adequate dentition. Bilateral nares within normal limits. Throat without erythema, ulcerations or exudates.  No cough on exam, no hoarseness on exam. Eyes:Pupils Equal Round Reactive to light, Extraocular movements intact,  Conjunctiva without redness, discharge or icterus. Neck/lymp/endocrine: Supple, no lymphadenopathy, no thyromegaly CV: RRR no murmur, no edema, +2/4 P posterior tibialis pulses.  No carotid bruits. No JVD. Chest: CTAB, no wheeze, rhonchi or crackles.  Normal respiratory effort.  Good air movement. Abd: Soft.  Flat. NTND. BS present.  No masses palpated. No hepatosplenomegaly. No rebound tenderness or guarding. Skin: N0 rashes, purpura or petechiae. Warm and well-perfused. Skin intact. Neuro/Msk:  Normal gait. PERLA. EOMi. Alert. Oriented x3.  Cranial nerves II through XII intact. Muscle strength 5/5 upper/lower extremity. DTRs equal bilaterally. Psych: Normal affect, dress and demeanor. Normal speech. Normal thought content and judgment.   No exam data present  Assessment/plan: Mallory Green is a 48 y.o. female present for CPE Menorrhagia with regular cycle -Patient  reports she still having heavy menses, however they are still occurring with regular cycle. - CBC - TSH - Iron, TIBC and Ferritin Panel Diabetes mellitus screening - Comp Met (CMET) - HgB A1c Screening cholesterol level - Lipid panel Encounter for screening mammogram for malignant neoplasm of breast - MM 3D SCREEN BREAST BILATERAL; Future Colon cancer screening - Ambulatory referral to Gastroenterology Hot flashes -New onset hot flashes over the last few months.  She is not having any other menopausal symptoms and her cycles are regular. -Discussed over-the-counter products to help with menopausal hot flashes.  However, given she has no other menopausal symptoms and her cycles have been remained regular would want to ensure hot flashes are actually hormonal in nature. - tsh - cmet -If lab results today do not indicate possible cause of hot flashes/night sweats, would encourage her to speak with her gynecologist about further testing on her hormone levels. Encounter for preventive health examination Patient was encouraged to exercise greater than 150 minutes a week. Patient was encouraged to choose a diet filled with fresh fruits and vegetables, and lean meats. AVS provided to patient today for education/recommendation on gender specific health and  safety maintenance. Colonoscopy: no fhx, screen at 45.>>  Referral placed today. Mammogram: completed: 12/2017. Breast center. Ordered by Raoul Pitch. >>  Order placed today. Cervical cancer screening: 2020- Dr.Cousins GYN  Immunizations: tdap 2016, Influenza UTD 2020 (encouraged yearly) Infectious disease screening: HIV completed  Return in about 1 year (around 12/30/2019) for CPE (30 min).  Orders Placed This Encounter  Procedures  . MM 3D SCREEN BREAST BILATERAL  . CBC  . Comp Met (CMET)  . HgB A1c  . Lipid panel  . TSH  . Iron, TIBC and Ferritin Panel  . Ambulatory referral to Gastroenterology     Electronically signed by: Howard Pouch, DO [place]

## 2018-12-30 NOTE — Patient Instructions (Signed)
Health Maintenance, Female Adopting a healthy lifestyle and getting preventive care are important in promoting health and wellness. Ask your health care provider about:  The right schedule for you to have regular tests and exams.  Things you can do on your own to prevent diseases and keep yourself healthy. What should I know about diet, weight, and exercise? Eat a healthy diet   Eat a diet that includes plenty of vegetables, fruits, low-fat dairy products, and lean protein.  Do not eat a lot of foods that are high in solid fats, added sugars, or sodium. Maintain a healthy weight Body mass index (BMI) is used to identify weight problems. It estimates body fat based on height and weight. Your health care provider can help determine your BMI and help you achieve or maintain a healthy weight. Get regular exercise Get regular exercise. This is one of the most important things you can do for your health. Most adults should:  Exercise for at least 150 minutes each week. The exercise should increase your heart rate and make you sweat (moderate-intensity exercise).  Do strengthening exercises at least twice a week. This is in addition to the moderate-intensity exercise.  Spend less time sitting. Even light physical activity can be beneficial. Watch cholesterol and blood lipids Have your blood tested for lipids and cholesterol at 48 years of age, then have this test every 5 years. Have your cholesterol levels checked more often if:  Your lipid or cholesterol levels are high.  You are older than 48 years of age.  You are at high risk for heart disease. What should I know about cancer screening? Depending on your health history and family history, you may need to have cancer screening at various ages. This may include screening for:  Breast cancer.  Cervical cancer.  Colorectal cancer.  Skin cancer.  Lung cancer. What should I know about heart disease, diabetes, and high blood  pressure? Blood pressure and heart disease  High blood pressure causes heart disease and increases the risk of stroke. This is more likely to develop in people who have high blood pressure readings, are of African descent, or are overweight.  Have your blood pressure checked: ? Every 3-5 years if you are 18-39 years of age. ? Every year if you are 40 years old or older. Diabetes Have regular diabetes screenings. This checks your fasting blood sugar level. Have the screening done:  Once every three years after age 40 if you are at a normal weight and have a low risk for diabetes.  More often and at a younger age if you are overweight or have a high risk for diabetes. What should I know about preventing infection? Hepatitis B If you have a higher risk for hepatitis B, you should be screened for this virus. Talk with your health care provider to find out if you are at risk for hepatitis B infection. Hepatitis C Testing is recommended for:  Everyone born from 1945 through 1965.  Anyone with known risk factors for hepatitis C. Sexually transmitted infections (STIs)  Get screened for STIs, including gonorrhea and chlamydia, if: ? You are sexually active and are younger than 48 years of age. ? You are older than 48 years of age and your health care provider tells you that you are at risk for this type of infection. ? Your sexual activity has changed since you were last screened, and you are at increased risk for chlamydia or gonorrhea. Ask your health care provider if   you are at risk.  Ask your health care provider about whether you are at high risk for HIV. Your health care provider may recommend a prescription medicine to help prevent HIV infection. If you choose to take medicine to prevent HIV, you should first get tested for HIV. You should then be tested every 3 months for as long as you are taking the medicine. Pregnancy  If you are about to stop having your period (premenopausal) and  you may become pregnant, seek counseling before you get pregnant.  Take 400 to 800 micrograms (mcg) of folic acid every day if you become pregnant.  Ask for birth control (contraception) if you want to prevent pregnancy. Osteoporosis and menopause Osteoporosis is a disease in which the bones lose minerals and strength with aging. This can result in bone fractures. If you are 65 years old or older, or if you are at risk for osteoporosis and fractures, ask your health care provider if you should:  Be screened for bone loss.  Take a calcium or vitamin D supplement to lower your risk of fractures.  Be given hormone replacement therapy (HRT) to treat symptoms of menopause. Follow these instructions at home: Lifestyle  Do not use any products that contain nicotine or tobacco, such as cigarettes, e-cigarettes, and chewing tobacco. If you need help quitting, ask your health care provider.  Do not use street drugs.  Do not share needles.  Ask your health care provider for help if you need support or information about quitting drugs. Alcohol use  Do not drink alcohol if: ? Your health care provider tells you not to drink. ? You are pregnant, may be pregnant, or are planning to become pregnant.  If you drink alcohol: ? Limit how much you use to 0-1 drink a day. ? Limit intake if you are breastfeeding.  Be aware of how much alcohol is in your drink. In the [place] one drink equals one 12 oz bottle of beer (355 mL), one 5 oz glass of wine (148 mL), or one 1 oz glass of hard liquor (44 mL). General instructions  Schedule regular health, dental, and eye exams.  Stay current with your vaccines.  Tell your health care provider if: ? You often feel depressed. ? You have ever been abused or do not feel safe at home. Summary  Adopting a healthy lifestyle and getting preventive care are important in promoting health and wellness.  Follow your health care provider's instructions about healthy  diet, exercising, and getting tested or screened for diseases.  Follow your health care provider's instructions on monitoring your cholesterol and blood pressure. This information is not intended to replace advice given to you by your health care provider. Make sure you discuss any questions you have with your health care provider. Document Released: 07/23/2010 Document Revised: 12/31/2017 Document Reviewed: 12/31/2017 Elsevier Patient Education  2020 Elsevier Inc.  

## 2018-12-31 ENCOUNTER — Telehealth: Payer: Self-pay | Admitting: Family Medicine

## 2018-12-31 DIAGNOSIS — D649 Anemia, unspecified: Secondary | ICD-10-CM

## 2018-12-31 DIAGNOSIS — R232 Flushing: Secondary | ICD-10-CM

## 2018-12-31 LAB — IRON,TIBC AND FERRITIN PANEL
%SAT: 72 % (calc) — ABNORMAL HIGH (ref 16–45)
Ferritin: 15 ng/mL — ABNORMAL LOW (ref 16–232)
Iron: 258 ug/dL — ABNORMAL HIGH (ref 40–190)
TIBC: 360 mcg/dL (calc) (ref 250–450)

## 2018-12-31 LAB — URINE CULTURE
MICRO NUMBER:: 1175832
SPECIMEN QUALITY:: ADEQUATE

## 2018-12-31 LAB — LIPID PANEL
Cholesterol: 158 mg/dL (ref <200)
HDL: 51 mg/dL (ref >=50)
LDL Cholesterol (Calc): 92 mg/dL (calc)
Non-HDL Cholesterol (Calc): 107 mg/dL (calc) (ref <130)
Total CHOL/HDL Ratio: 3.1 (calc) (ref <5.0)
Triglycerides: 64 mg/dL (ref <150)

## 2018-12-31 LAB — HEMOGLOBIN A1C
Hgb A1c MFr Bld: 4.8 % of total Hgb (ref <5.7)
Mean Plasma Glucose: 91 (calc)
eAG (mmol/L): 5 (calc)

## 2018-12-31 LAB — COMPREHENSIVE METABOLIC PANEL
AG Ratio: 1.3 (calc) (ref 1.0–2.5)
ALT: 11 U/L (ref 6–29)
AST: 18 U/L (ref 10–35)
Albumin: 4.1 g/dL (ref 3.6–5.1)
Alkaline phosphatase (APISO): 43 U/L (ref 31–125)
BUN: 9 mg/dL (ref 7–25)
CO2: 25 mmol/L (ref 20–32)
Calcium: 8.9 mg/dL (ref 8.6–10.2)
Chloride: 105 mmol/L (ref 98–110)
Creat: 0.91 mg/dL (ref 0.50–1.10)
Globulin: 3.2 g/dL (calc) (ref 1.9–3.7)
Glucose, Bld: 100 mg/dL — ABNORMAL HIGH (ref 65–99)
Potassium: 3.6 mmol/L (ref 3.5–5.3)
Sodium: 137 mmol/L (ref 135–146)
Total Bilirubin: 0.9 mg/dL (ref 0.2–1.2)
Total Protein: 7.3 g/dL (ref 6.1–8.1)

## 2018-12-31 LAB — CBC
HCT: 34.4 % — ABNORMAL LOW (ref 35.0–45.0)
Hemoglobin: 10.9 g/dL — ABNORMAL LOW (ref 11.7–15.5)
MCH: 28.2 pg (ref 27.0–33.0)
MCHC: 31.7 g/dL — ABNORMAL LOW (ref 32.0–36.0)
MCV: 89.1 fL (ref 80.0–100.0)
MPV: 10.2 fL (ref 7.5–12.5)
Platelets: 324 10*3/uL (ref 140–400)
RBC: 3.86 10*6/uL (ref 3.80–5.10)
RDW: 12.9 % (ref 11.0–15.0)
WBC: 4.7 10*3/uL (ref 3.8–10.8)

## 2018-12-31 LAB — TSH: TSH: 0.91 mIU/L

## 2018-12-31 NOTE — Telephone Encounter (Signed)
Patient advised of results and provider's comments. She reports she is not taking iron supplements but her multivitamin may contain some iron.

## 2018-12-31 NOTE — Telephone Encounter (Signed)
Please inform patient the following information: Her liver, kidney, electrolytes and thyroid are all within normal levels. Her cholesterol panel looks great. Her blood counts are consistent with her prior results with mild anemia still present, although mildly improved.  Her iron levels are concerning.  Please ask the patient if she is taking an iron supplementation.  There is not one on her med list.  However her iron panel looks similar to the patient that is over taking iron or has iron overload.  Iron overload could cause night sweats-which she was complaining of during her visit. If she is not taking an iron supplementation, we will need to refer her to a hematologist for further evaluation.  If she is taking an iron supplementation I would ask her to stop the supplement, and follow-up here in 3 months for retesting.  Please advise me of her answers- thank you.

## 2019-01-01 NOTE — Telephone Encounter (Signed)
I would advised her to avoid any iron even in a multivitamin for now. I will refer her to hematology for further evaluation.  There are abnormalities in her iron could be the cause of her sweating/night sweats.  It is perplexing that she is overloaded in iron, without taking a supplement and with her history of heavy menstrual cycles and anemia (along with low ferritin-which is iron stores).  There are certain conditions of the liver that can cause increased in iron and hematology will evaluate her for those.

## 2019-01-01 NOTE — Addendum Note (Signed)
Addended by: Howard Pouch A on: 01/01/2019 04:26 PM   Modules accepted: Orders

## 2019-01-01 NOTE — Telephone Encounter (Signed)
Called patient phone went straight to voicemail, unable to leave voicemail.

## 2019-01-04 ENCOUNTER — Telehealth: Payer: Self-pay | Admitting: Hematology

## 2019-01-04 NOTE — Telephone Encounter (Signed)
Pt was called and given information, she has appt next week with hematology. Pt verbalized understanding

## 2019-01-04 NOTE — Telephone Encounter (Signed)
A new hem appt has been scheduled for Mallory Green to see Dr. Irene Limbo on 12/22 at 1pm. Pt aware to arrive 15 minutes early.

## 2019-01-12 ENCOUNTER — Inpatient Hospital Stay: Payer: 59

## 2019-01-12 ENCOUNTER — Other Ambulatory Visit: Payer: Self-pay

## 2019-01-12 ENCOUNTER — Inpatient Hospital Stay: Payer: 59 | Attending: Hematology | Admitting: Hematology

## 2019-01-12 VITALS — BP 115/80 | HR 66 | Temp 97.8°F | Resp 18 | Ht 66.0 in | Wt 157.2 lb

## 2019-01-12 DIAGNOSIS — R232 Flushing: Secondary | ICD-10-CM | POA: Insufficient documentation

## 2019-01-12 DIAGNOSIS — N920 Excessive and frequent menstruation with regular cycle: Secondary | ICD-10-CM | POA: Insufficient documentation

## 2019-01-12 DIAGNOSIS — Z803 Family history of malignant neoplasm of breast: Secondary | ICD-10-CM | POA: Diagnosis not present

## 2019-01-12 DIAGNOSIS — R5383 Other fatigue: Secondary | ICD-10-CM | POA: Insufficient documentation

## 2019-01-12 DIAGNOSIS — Z79899 Other long term (current) drug therapy: Secondary | ICD-10-CM | POA: Diagnosis not present

## 2019-01-12 DIAGNOSIS — D509 Iron deficiency anemia, unspecified: Secondary | ICD-10-CM

## 2019-01-12 LAB — CBC WITH DIFFERENTIAL/PLATELET
Abs Immature Granulocytes: 0 10*3/uL (ref 0.00–0.07)
Basophils Absolute: 0 10*3/uL (ref 0.0–0.1)
Basophils Relative: 1 %
Eosinophils Absolute: 0.1 10*3/uL (ref 0.0–0.5)
Eosinophils Relative: 1 %
HCT: 34.5 % — ABNORMAL LOW (ref 36.0–46.0)
Hemoglobin: 10.8 g/dL — ABNORMAL LOW (ref 12.0–15.0)
Immature Granulocytes: 0 %
Lymphocytes Relative: 39 %
Lymphs Abs: 1.7 10*3/uL (ref 0.7–4.0)
MCH: 28.7 pg (ref 26.0–34.0)
MCHC: 31.3 g/dL (ref 30.0–36.0)
MCV: 91.8 fL (ref 80.0–100.0)
Monocytes Absolute: 0.5 10*3/uL (ref 0.1–1.0)
Monocytes Relative: 12 %
Neutro Abs: 2.1 10*3/uL (ref 1.7–7.7)
Neutrophils Relative %: 47 %
Platelets: 299 10*3/uL (ref 150–400)
RBC: 3.76 MIL/uL — ABNORMAL LOW (ref 3.87–5.11)
RDW: 14.2 % (ref 11.5–15.5)
WBC: 4.5 10*3/uL (ref 4.0–10.5)
nRBC: 0 % (ref 0.0–0.2)

## 2019-01-12 LAB — CMP (CANCER CENTER ONLY)
ALT: 14 U/L (ref 0–44)
AST: 22 U/L (ref 15–41)
Albumin: 3.9 g/dL (ref 3.5–5.0)
Alkaline Phosphatase: 55 U/L (ref 38–126)
Anion gap: 10 (ref 5–15)
BUN: 8 mg/dL (ref 6–20)
CO2: 25 mmol/L (ref 22–32)
Calcium: 8.6 mg/dL — ABNORMAL LOW (ref 8.9–10.3)
Chloride: 106 mmol/L (ref 98–111)
Creatinine: 0.9 mg/dL (ref 0.44–1.00)
GFR, Est AFR Am: >60 mL/min (ref >60)
GFR, Estimated: >60 mL/min (ref >60)
Glucose, Bld: 89 mg/dL (ref 70–99)
Potassium: 4.2 mmol/L (ref 3.5–5.1)
Sodium: 141 mmol/L (ref 135–145)
Total Bilirubin: 0.6 mg/dL (ref 0.3–1.2)
Total Protein: 7.6 g/dL (ref 6.5–8.1)

## 2019-01-12 LAB — IRON AND TIBC
Iron: 59 ug/dL (ref 41–142)
Saturation Ratios: 18 % — ABNORMAL LOW (ref 21–57)
TIBC: 333 ug/dL (ref 236–444)
UIBC: 273 ug/dL (ref 120–384)

## 2019-01-12 LAB — FERRITIN: Ferritin: 19 ng/mL (ref 11–307)

## 2019-01-12 MED ORDER — POLYSACCHARIDE IRON COMPLEX 150 MG PO CAPS: 150.0000 mg | ORAL_CAPSULE | Freq: Every day | ORAL | 3 refills | Status: DC

## 2019-01-12 NOTE — Patient Instructions (Signed)
Thank you for choosing [facility] to provide your oncology and hematology care.   Should you have questions after your visit to the [facility] ([facility]), please contact this office at [phone] between 8:30 AM and 4:30 PM.  Voice mails left after 4:00 PM may not be returned until the following business day.  Calls received after 4:30 PM will be answered by an off-site Nurse Triage Line.    Prescription Refills:  Please have your pharmacy contact us directly for most prescription requests.  Contact the office directly for refills of narcotics (pain medications). Allow 48-72 hours for refills.  Appointments: Please contact the [facility] scheduling department [phone] for questions regarding [facility] appointment scheduling.  Contact the schedulers with any scheduling changes so that your appointment can be rescheduled in a timely manner.   Central Scheduling for [place] [phone] - Call to schedule procedures such as PET scans, CT scans, MRI, Ultrasound, etc.  To afford each patient quality time with our providers, please arrive 30 minutes before your scheduled appointment time.  If you arrive late for your appointment, you may be asked to reschedule.  We strive to give you quality time with our providers, and arriving late affects you and other patients whose appointments are after yours. If you are a no show for multiple scheduled visits, you may be dismissed from the clinic at the providers discretion.     Resources: [facility] Social Workers [phone] for additional information on assistance programs --Anne Cunningham/Abigail Elmore  [place] DSS  [phone]: Information regarding food stamps, Medicaid, and utility assistance [employer] [phone]   [place]'s shared-ride transportation service for eligible riders who have a disability that prevents them from riding the fixed route bus.   Medicare Rights Center 800-333-4114 Helps people with  Medicare understand their rights and benefits, navigate the Medicare system, and secure the quality healthcare they deserve [employer] 800-227-2345 Assists patients locate various types of support and financial assistance Cancer Care: 1-800-813-HOPE (4673) Provides financial assistance, online support groups, medication/co-pay assistance.   Transportation Assistance for appointments at [facility]: Transportation Coordinator [phone]  Again, thank you for choosing  Cancer Center for your care.       

## 2019-01-12 NOTE — Progress Notes (Signed)
HEMATOLOGY/ONCOLOGY CONSULTATION NOTE  Date of Service: 01/14/2019  Patient Care Team: Ma Hillock, DO as PCP - General (Family Medicine) Servando Salina, MD as Consulting Physician (Obstetrics and Gynecology)  CHIEF COMPLAINTS/PURPOSE OF CONSULTATION:  Iron deficiency/anemia  HISTORY OF PRESENTING ILLNESS:  Mallory Green is a wonderful 48 y.o. female who has been referred to [place] by Howard Pouch, DO for evaluation and management of iron deficiency. The pt reports that she is doing well overall.   The pt reports that she was first told of her anemia 2-3 years ago. During this time she also began to experience menorrhagia. Pt notes that she had a UTI when her last labs were drawn. Pt has previously taken PO iron to help with her anemia and had no problems tolerating it. She does not take any acid-suppressants or any other medications that might affect her nutrition absorption. Recently she has noticed that her overall body temperature has gotten warmer and she has hot flashes at night. Pt drinks a sufficient amount of water daily. She plans to see her OBGYN soon, but has had to push back the appointment due to the pandemic.   Her periods typically last for 4 days and are heavy for 2. They tend to be very regular as well. She has had no other bleeding concerns including nose bleeds, gum bleeds and bloody/black stools.   Pt has otherwise been very healthy. She has no other chronic conditions but had a disc repair surgery. Pt has been taking a daily multivitamin, as well as a Vitamin D, Vitamin C and Zinc. Her mother was diagnosed with breast cancer around the age of 48. She denies any other family history of blood or autoimmune disorders or cancers. Pt denies any food intolerances and eats a diet rich in iron. She has never been a smoker and does not drink alcohol. 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   Most recent lab results (12/30/2018) of CBC and CMET is as  follows: all values are WNL except for Hgb at 10.9, Hematocrit at 34.4, MCHC at 31.7, Glucose at 100.  12/30/2018 TSH at 0.91 12/30/2018 Iron, TIBC and Ferritin is as follows: Iron at 258, TIBC at 360, Sat Ratios at 72, Ferritin at 15  On review of systems, pt reports hot flashes, fatigue and denies bowel movement issues, unexpected weight loss, abdominal pain, nose bleeds, gum bleeds, bloody/black stools and any other symptoms.   On PMHx the pt reports Menorrhagia, Disc repair surgery. On Social Hx the pt reports that she is a non-smoker and does not drink alcohol On Family Hx the pt reports that her mother was diagnosed with breast cancer around 48 y.o.  MEDICAL HISTORY:  Past Medical History:  Diagnosis Date  . Menorrhagia     SURGICAL HISTORY: Past Surgical History:  Procedure Laterality Date  . Disc surgery L4  2008    SOCIAL HISTORY: Social History   Socioeconomic History  . Marital status: Married    Spouse name: Not on file  . Number of children: 2  . Years of education: MSRN  . Highest education level: Not on file  Occupational History  . Occupation: Therapist, sports  Tobacco Use  . Smoking status: Never Smoker  . Smokeless tobacco: Never Used  Substance and Sexual Activity  . Alcohol use: Yes  . Drug use: No  . Sexual activity: Yes    Partners: Male    Birth control/protection: Condom  Other Topics Concern  .  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    Drinks caffeine. Uses herbal remedies. Takes a daily vitamin.   Wears her seatbelt, wears a bicycle helmet, smoke detectors in the home.   Safe in her relationships.   Social Determinants of Health   Financial Resource Strain:   . Difficulty of Paying Living Expenses: Not on file  Food Insecurity:   . Worried About Programme researcher, broadcasting/film/video in the Last Year: Not on file  . Ran Out of Food in the Last Year: Not on file  Transportation Needs:   . Lack of Transportation (Medical): Not on file    . Lack of Transportation (Non-Medical): Not on file  Physical Activity:   . Days of Exercise per Week: Not on file  . Minutes of Exercise per Session: Not on file  Stress:   . Feeling of Stress : Not on file  Social Connections:   . Frequency of Communication with Friends and Family: Not on file  . Frequency of Social Gatherings with Friends and Family: Not on file  . Attends Religious Services: Not on file  . Active Member of Clubs or Organizations: Not on file  . Attends Banker Meetings: Not on file  . Marital Status: Not on file  Intimate Partner Violence:   . Fear of Current or Ex-Partner: Not on file  . Emotionally Abused: Not on file  . Physically Abused: Not on file  . Sexually Abused: Not on file    FAMILY HISTORY: Family History  Problem Relation Age of Onset  . Hypertension Father   . Diabetes Father   . Diabetes Mother   . Heart disease Mother   . Breast cancer Mother 15  . Breast cancer Sister 53    ALLERGIES:  has No Known Allergies.  MEDICATIONS:  Current Outpatient Medications  Medication Sig Dispense Refill  . Ascorbic Acid (VITAMIN C) 100 MG tablet Take 100 mg by mouth daily.    . Multiple Vitamins-Minerals (MULTIVITAMIN ADULT PO) Take by mouth.    . Vitamin D, Cholecalciferol, 25 MCG (1000 UT) CAPS Take by mouth.    . Zinc 25 MG TABS Take by mouth.    . iron polysaccharides (NIFEREX) 150 MG capsule Take 1 capsule (150 mg total) by mouth daily. 30 capsule 3   No current facility-administered medications for this visit.    REVIEW OF SYSTEMS:    10 Point review of Systems was done is negative except as noted above.  PHYSICAL EXAMINATION: ECOG PERFORMANCE STATUS: 0 - Asymptomatic  . Vitals:   01/12/19 1333  BP: 115/80  Pulse: 66  Resp: 18  Temp: 97.8 F (36.6 C)  SpO2: 100%   Filed Weights   01/12/19 1333  Weight: 157 lb 3.2 oz (71.3 kg)   .Body mass index is 25.37 kg/m.  GENERAL:alert, in no acute distress and  comfortable SKIN: no acute rashes, no significant lesions EYES: conjunctiva are pink and non-injected, sclera anicteric OROPHARYNX: MMM, no exudates, no oropharyngeal erythema or ulceration NECK: supple, no JVD LYMPH:  no palpable lymphadenopathy in the cervical, axillary or inguinal regions LUNGS: clear to auscultation b/l with normal respiratory effort HEART: regular rate & rhythm ABDOMEN:  normoactive bowel sounds , non tender, not distended. Extremity: no pedal edema PSYCH: alert & oriented x 3 with fluent speech NEURO: no focal motor/sensory deficits  LABORATORY DATA:  I have reviewed the data as listed  . CBC Latest Ref  Rng & Units 01/12/2019 12/30/2018 08/18/2017  WBC 4.0 - 10.5 K/uL 4.5 4.7 5.0  Hemoglobin 12.0 - 15.0 g/dL 10.8(L) 10.9(L) 10.0(L)  Hematocrit 36.0 - 46.0 % 34.5(L) 34.4(L) 31.9(L)  Platelets 150 - 400 K/uL 299 324 305.0    . CMP Latest Ref Rng & Units 01/12/2019 12/30/2018 08/18/2017  Glucose 70 - 99 mg/dL 89 098(J100(H) 86  BUN 6 - 20 mg/dL 8 9 13   Creatinine 0.44 - 1.00 mg/dL 1.910.90 4.780.91 2.950.90  Sodium 135 - 145 mmol/L 141 137 139  Potassium 3.5 - 5.1 mmol/L 4.2 3.6 4.4  Chloride 98 - 111 mmol/L 106 105 106  CO2 22 - 32 mmol/L 25 25 26   Calcium 8.9 - 10.3 mg/dL 6.2(Z8.6(L) 8.9 9.1  Total Protein 6.5 - 8.1 g/dL 7.6 7.3 7.6  Total Bilirubin 0.3 - 1.2 mg/dL 0.6 0.9 0.5  Alkaline Phos 38 - 126 U/L 55 - 37(L)  AST 15 - 41 U/L 22 18 18   ALT 0 - 44 U/L 14 11 12      RADIOGRAPHIC STUDIES: I have personally reviewed the radiological images as listed and agreed with the findings in the report. No results found.  ASSESSMENT & PLAN:   48 yo with   1) Iron deficiency Anemia PLAN: -Discussed patient's most recent labs from 12/30/2018, all values are WNL except for Hgb at 10.9, Hematocrit at 34.4, MCHC at 31.7, Glucose at 100.  -Discussed12/09/2018 TSH at 0.91 -Discussed12/09/2018 Iron, TIBC and Ferritin is as follows: Iron at 258, TIBC at 360, Sat Ratios at 72, Ferritin  at 15 -Pt has mild anemia which is likely caused by menorrhagia, other blood counts are normal -No signs of GI bleeding or medications known to cause anemia or affect iron absorption -Recommended pt begin 150 mg PO Iron Polysaccharide daily -Recommended pt f/u with OBGYN for menorrhagia management -Will get labs today -Will see back in 4 months with labs  FOLLOW UP: Labs today RTC with Dr Candise CheKale with labs in 4 months  All of the patients questions were answered with apparent satisfaction. The patient knows to call the clinic with any problems, questions or concerns.  I spent 25 mins counseling the patient face to face. The total time spent in the appointment was 35 mintues and more than 50% was on counseling and direct patient cares.    Wyvonnia LoraGautam Shandrell Boda MD MS AAHIVMS [facility] Hematology/Oncology Physician [facility]  (Office):       [phone] (Work cell):  [phone] (Fax):           [phone]  01/14/2019 7:23 AM  I, Carollee HerterJazzmine Knight, am acting as a scribe for Dr. Wyvonnia LoraGautam Marsheila Alejo.   .I have reviewed the above documentation for accuracy and completeness, and I agree with the above. Johney Maine.Roderic Lammert Kishore Solace Manwarren MD

## 2019-01-19 ENCOUNTER — Telehealth: Payer: Self-pay | Admitting: Hematology

## 2019-01-19 NOTE — Telephone Encounter (Signed)
Scheduled appt per 12/29 sch message - pt is aware of apt date and time   

## 2019-02-01 ENCOUNTER — Encounter: Payer: Self-pay | Admitting: Family Medicine

## 2019-02-19 ENCOUNTER — Ambulatory Visit
Admission: RE | Admit: 2019-02-19 | Discharge: 2019-02-19 | Disposition: A | Discharge status: Home / Self Care | Payer: 59 | Source: Ambulatory Visit | Attending: Family Medicine | Admitting: Family Medicine

## 2019-02-19 ENCOUNTER — Other Ambulatory Visit: Payer: Self-pay

## 2019-02-19 DIAGNOSIS — Z1231 Encounter for screening mammogram for malignant neoplasm of breast: Secondary | ICD-10-CM

## 2019-02-22 ENCOUNTER — Telehealth: Payer: Self-pay | Admitting: Family Medicine

## 2019-02-22 NOTE — Telephone Encounter (Signed)
Please inform patient the following information: Mammogram is normal.   

## 2019-02-22 NOTE — Telephone Encounter (Signed)
Pt was called and given information.  

## 2019-04-25 NOTE — Progress Notes (Signed)
HEMATOLOGY/ONCOLOGY CONSULTATION NOTE  Date of Service: 04/26/2019  Patient Care Team: Natalia Leatherwood, DO as PCP - General (Family Medicine) Maxie Better, MD as Consulting Physician (Obstetrics and Gynecology)  CHIEF COMPLAINTS/PURPOSE OF CONSULTATION:  Iron deficiency/anemia  HISTORY OF PRESENTING ILLNESS:  Mallory Green is a wonderful 49 y.o. female who has been referred to [place] by Felix Pacini, DO for evaluation and management of iron deficiency. The pt reports that she is doing well overall.   The pt reports that she was first told of her anemia 2-3 years ago. During this time she also began to experience menorrhagia. Pt notes that she had a UTI when her last labs were drawn. Pt has previously taken PO iron to help with her anemia and had no problems tolerating it. She does not take any acid-suppressants or any other medications that might affect her nutrition absorption. Recently she has noticed that her overall body temperature has gotten warmer and she has hot flashes at night. Pt drinks a sufficient amount of water daily. She plans to see her OBGYN soon, but has had to push back the appointment due to the pandemic.   Her periods typically last for 4 days and are heavy for 2. They tend to be very regular as well. She has had no other bleeding concerns including nose bleeds, gum bleeds and bloody/black stools.   Pt has otherwise been very healthy. She has no other chronic conditions but had a disc repair surgery. Pt has been taking a daily multivitamin, as well as a Vitamin D, Vitamin C and Zinc. Her mother was diagnosed with breast cancer around the age of 44. She denies any other family history of blood or autoimmune disorders or cancers. Pt denies any food intolerances and eats a diet rich in iron. She has never been a smoker and does not drink alcohol. 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   Most recent lab results (12/30/2018) of CBC and CMET is as  follows: all values are WNL except for Hgb at 10.9, Hematocrit at 34.4, MCHC at 31.7, Glucose at 100.  12/30/2018 TSH at 0.91 12/30/2018 Iron, TIBC and Ferritin is as follows: Iron at 258, TIBC at 360, Sat Ratios at 72, Ferritin at 15  On review of systems, pt reports hot flashes, fatigue and denies bowel movement issues, unexpected weight loss, abdominal pain, nose bleeds, gum bleeds, bloody/black stools and any other symptoms.   On PMHx the pt reports Menorrhagia, Disc repair surgery. On Social Hx the pt reports that she is a non-smoker and does not drink alcohol On Family Hx the pt reports that her mother was diagnosed with breast cancer around 49 y.o.  INTERVAL HISTORY:  Mallory Green is a wonderful 49 y.o. female who is here for evaluation and management of iron deficiency. The patient's last visit with [place] was on 01/12/2019. The pt reports that she is doing well overall.  The pt reports that she was taking her PO iron regularly, but stopped one month ago. She had some occasional constipation that was improved with increased water intake.   Pt reports that the length of her periods have shortened slightly, now down to three days, and she is having one heavy day per cycle. She has not seen her OBGYN in the interim.   Lab results today (04/26/19) of CBC w/diff is as follows: all values are WNL.  04/26/2019 Ferritin at 12 04/26/2019 Iron and TIBC is as follows:  Iron at 42, TIBC at 320, Sat Ratios at 13, UIBC at 278  On review of systems, pt denies fatigue, abdominal pain, diarrhea and any other symptoms.   MEDICAL HISTORY:  Past Medical History:  Diagnosis Date  . Menorrhagia     SURGICAL HISTORY: Past Surgical History:  Procedure Laterality Date  . Disc surgery L4  2008    SOCIAL HISTORY: Social History   Socioeconomic History  . Marital status: Married    Spouse name: Not on file  . Number of children: 2  . Years of education: MSRN  . Highest education level: Not on file   Occupational History  . Occupation: Therapist, sports  Tobacco Use  . Smoking status: Never Smoker  . Smokeless tobacco: Never Used  Substance and Sexual Activity  . Alcohol use: Yes  . Drug use: No  . Sexual activity: Yes    Partners: Male    Birth control/protection: Condom  Other Topics Concern  . 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    Drinks caffeine. Uses herbal remedies. Takes a daily vitamin.   Wears her seatbelt, wears a bicycle helmet, smoke detectors in the home.   Safe in her relationships.   Social Determinants of Health   Financial Resource Strain:   . Difficulty of Paying Living Expenses:   Food Insecurity:   . Worried About Charity fundraiser in the Last Year:   . Arboriculturist in the Last Year:   Transportation Needs:   . Film/video editor (Medical):   Marland Kitchen Lack of Transportation (Non-Medical):   Physical Activity:   . Days of Exercise per Week:   . Minutes of Exercise per Session:   Stress:   . Feeling of Stress :   Social Connections:   . Frequency of Communication with Friends and Family:   . Frequency of Social Gatherings with Friends and Family:   . Attends Religious Services:   . Active Member of Clubs or Organizations:   . Attends Archivist Meetings:   Marland Kitchen Marital Status:   Intimate Partner Violence:   . Fear of Current or Ex-Partner:   . Emotionally Abused:   Marland Kitchen Physically Abused:   . Sexually Abused:     FAMILY HISTORY: Family History  Problem Relation Age of Onset  . Hypertension Father   . Diabetes Father   . Diabetes Mother   . Heart disease Mother   . Breast cancer Mother 52  . Breast cancer Sister 24    ALLERGIES:  has No Known Allergies.  MEDICATIONS:  Current Outpatient Medications  Medication Sig Dispense Refill  . Ascorbic Acid (VITAMIN C) 100 MG tablet Take 100 mg by mouth daily.    . iron polysaccharides (NIFEREX) 150 MG capsule Take 1 capsule (150 mg total) by mouth daily. 30  capsule 5  . Multiple Vitamins-Minerals (MULTIVITAMIN ADULT PO) Take by mouth.    . Vitamin D, Cholecalciferol, 25 MCG (1000 UT) CAPS Take by mouth.    . Zinc 25 MG TABS Take by mouth.     No current facility-administered medications for this visit.    REVIEW OF SYSTEMS:   A 10+ POINT REVIEW OF SYSTEMS WAS OBTAINED including neurology, dermatology, psychiatry, cardiac, respiratory, lymph, extremities, GI, GU, Musculoskeletal, constitutional, breasts, reproductive, HEENT.  All pertinent positives are noted in the HPI.  All others are negative.   PHYSICAL EXAMINATION: ECOG PERFORMANCE STATUS: 0 - Asymptomatic  . Vitals:  04/26/19 1046  BP: 95/63  Pulse: 77  Resp: 18  Temp: 98.3 F (36.8 C)  SpO2: 100%   Filed Weights   04/26/19 1046  Weight: 157 lb 8 oz (71.4 kg)   .Body mass index is 25.42 kg/m.   GENERAL:alert, in no acute distress and comfortable SKIN: no acute rashes, no significant lesions EYES: conjunctiva are pink and non-injected, sclera anicteric OROPHARYNX: MMM, no exudates, no oropharyngeal erythema or ulceration NECK: supple, no JVD LYMPH:  no palpable lymphadenopathy in the cervical, axillary or inguinal regions LUNGS: clear to auscultation b/l with normal respiratory effort HEART: regular rate & rhythm ABDOMEN:  normoactive bowel sounds , non tender, not distended. No palpable hepatosplenomegaly.  Extremity: no pedal edema PSYCH: alert & oriented x 3 with fluent speech NEURO: no focal motor/sensory deficits  LABORATORY DATA:  I have reviewed the data as listed  . CBC Latest Ref Rng & Units 04/26/2019 01/12/2019 12/30/2018  WBC 4.0 - 10.5 K/uL 4.2 4.5 4.7  Hemoglobin 12.0 - 15.0 g/dL 18.2 10.8(L) 10.9(L)  Hematocrit 36.0 - 46.0 % 38.2 34.5(L) 34.4(L)  Platelets 150 - 400 K/uL 294 299 324    . CMP Latest Ref Rng & Units 01/12/2019 12/30/2018 08/18/2017  Glucose 70 - 99 mg/dL 89 993(Z) 86  BUN 6 - 20 mg/dL 8 9 13   Creatinine 0.44 - 1.00 mg/dL 1.69  6.78  Sodium 135 - 145 mmol/L 141 137 139  Potassium 3.5 - 5.1 mmol/L 4.2 3.6 4.4  Chloride 98 - 111 mmol/L 106 105 106  CO2 22 - 32 mmol/L 25 25 26   Calcium 8.9 - 10.3 mg/dL 9.38) 8.9 9.1  Total Protein 6.5 - 8.1 g/dL 7.6 7.3 7.6  Total Bilirubin 0.3 - 1.2 mg/dL 0.6 0.9 0.5  Alkaline Phos 38 - 126 U/L 55 - 37(L)  AST 15 - 41 U/L 22 18 18   ALT 0 - 44 U/L 14 11 12      RADIOGRAPHIC STUDIES: I have personally reviewed the radiological images as listed and agreed with the findings in the report. No results found.  ASSESSMENT & PLAN:   49 yo with   1) Iron deficiency Anemia PLAN: -Discussed pt labwork today, 04/26/19; anemia has corrected -Discussed 04/26/2019 Ferritin at 12, Goal Ferritin >= 50 -Discussed 04/26/2019 Iron and TIBC is as follows: Iron at 42, TIBC at 320, Sat Ratios at 13, UIBC at 278 -Recommend pt continue taking 150 mg PO Iron daily until Ferritin at goal. She can then take 2-3 times per week.  -Recommend pt continue following with Dr. 52 to monitor labs  -Will see back as needed   FOLLOW UP: RTC with PCP.  The total time spent in the appt was 20 minutes and more than 50% was on counseling and direct patient cares.  All of the patient's questions were answered with apparent satisfaction. The patient knows to call the clinic with any problems, questions or concerns.    06/26/19 MD MS AAHIVMS [facility] Hematology/Oncology Physician [facility]  (Office):       [phone] (Work cell):  [phone] (Fax):           [phone]  04/26/2019 12:25 PM  I, [phone], am acting as a scribe for Dr. [phone].   .I have reviewed the above documentation for accuracy and completeness, and I agree with the above. [phone] MD

## 2019-04-26 ENCOUNTER — Inpatient Hospital Stay: Payer: 59 | Attending: Hematology | Admitting: Hematology

## 2019-04-26 ENCOUNTER — Inpatient Hospital Stay: Payer: 59

## 2019-04-26 ENCOUNTER — Other Ambulatory Visit: Payer: Self-pay

## 2019-04-26 VITALS — BP 95/63 | HR 77 | Temp 98.3°F | Resp 18 | Ht 66.0 in | Wt 157.5 lb

## 2019-04-26 DIAGNOSIS — Z79899 Other long term (current) drug therapy: Secondary | ICD-10-CM | POA: Diagnosis not present

## 2019-04-26 DIAGNOSIS — K5900 Constipation, unspecified: Secondary | ICD-10-CM | POA: Insufficient documentation

## 2019-04-26 DIAGNOSIS — R232 Flushing: Secondary | ICD-10-CM | POA: Insufficient documentation

## 2019-04-26 DIAGNOSIS — D509 Iron deficiency anemia, unspecified: Secondary | ICD-10-CM

## 2019-04-26 DIAGNOSIS — R5383 Other fatigue: Secondary | ICD-10-CM | POA: Insufficient documentation

## 2019-04-26 DIAGNOSIS — Z803 Family history of malignant neoplasm of breast: Secondary | ICD-10-CM | POA: Insufficient documentation

## 2019-04-26 DIAGNOSIS — R109 Unspecified abdominal pain: Secondary | ICD-10-CM | POA: Insufficient documentation

## 2019-04-26 DIAGNOSIS — R630 Anorexia: Secondary | ICD-10-CM | POA: Diagnosis not present

## 2019-04-26 LAB — CBC WITH DIFFERENTIAL/PLATELET
Abs Immature Granulocytes: 0.01 10*3/uL (ref 0.00–0.07)
Basophils Absolute: 0 10*3/uL (ref 0.0–0.1)
Basophils Relative: 1 %
Eosinophils Absolute: 0.1 10*3/uL (ref 0.0–0.5)
Eosinophils Relative: 1 %
HCT: 38.2 % (ref 36.0–46.0)
Hemoglobin: 12.2 g/dL (ref 12.0–15.0)
Immature Granulocytes: 0 %
Lymphocytes Relative: 32 %
Lymphs Abs: 1.3 10*3/uL (ref 0.7–4.0)
MCH: 29.1 pg (ref 26.0–34.0)
MCHC: 31.9 g/dL (ref 30.0–36.0)
MCV: 91.2 fL (ref 80.0–100.0)
Monocytes Absolute: 0.4 10*3/uL (ref 0.1–1.0)
Monocytes Relative: 10 %
Neutro Abs: 2.4 10*3/uL (ref 1.7–7.7)
Neutrophils Relative %: 56 %
Platelets: 294 10*3/uL (ref 150–400)
RBC: 4.19 MIL/uL (ref 3.87–5.11)
RDW: 15 % (ref 11.5–15.5)
WBC: 4.2 10*3/uL (ref 4.0–10.5)
nRBC: 0 % (ref 0.0–0.2)

## 2019-04-26 LAB — FERRITIN: Ferritin: 12 ng/mL (ref 11–307)

## 2019-04-26 LAB — IRON AND TIBC
Iron: 42 ug/dL (ref 41–142)
Saturation Ratios: 13 % — ABNORMAL LOW (ref 21–57)
TIBC: 320 ug/dL (ref 236–444)
UIBC: 278 ug/dL (ref 120–384)

## 2019-04-26 MED ORDER — POLYSACCHARIDE IRON COMPLEX 150 MG PO CAPS: 150.0000 mg | ORAL_CAPSULE | Freq: Every day | ORAL | 5 refills | Status: DC

## 2020-01-12 ENCOUNTER — Encounter: Payer: Self-pay | Admitting: Family Medicine

## 2020-01-12 ENCOUNTER — Ambulatory Visit (INDEPENDENT_AMBULATORY_CARE_PROVIDER_SITE_OTHER): Payer: 59 | Admitting: Family Medicine

## 2020-01-12 ENCOUNTER — Other Ambulatory Visit: Payer: Self-pay

## 2020-01-12 VITALS — BP 111/74 | HR 75 | Temp 98.4°F | Ht 65.5 in | Wt 158.0 lb

## 2020-01-12 DIAGNOSIS — Z0001 Encounter for general adult medical examination with abnormal findings: Secondary | ICD-10-CM | POA: Diagnosis not present

## 2020-01-12 DIAGNOSIS — R790 Abnormal level of blood mineral: Secondary | ICD-10-CM | POA: Insufficient documentation

## 2020-01-12 DIAGNOSIS — Z131 Encounter for screening for diabetes mellitus: Secondary | ICD-10-CM

## 2020-01-12 DIAGNOSIS — D509 Iron deficiency anemia, unspecified: Secondary | ICD-10-CM | POA: Insufficient documentation

## 2020-01-12 DIAGNOSIS — R490 Dysphonia: Secondary | ICD-10-CM

## 2020-01-12 DIAGNOSIS — Z13220 Encounter for screening for lipoid disorders: Secondary | ICD-10-CM

## 2020-01-12 DIAGNOSIS — Z1159 Encounter for screening for other viral diseases: Secondary | ICD-10-CM

## 2020-01-12 DIAGNOSIS — Z1231 Encounter for screening mammogram for malignant neoplasm of breast: Secondary | ICD-10-CM

## 2020-01-12 DIAGNOSIS — N921 Excessive and frequent menstruation with irregular cycle: Secondary | ICD-10-CM | POA: Diagnosis not present

## 2020-01-12 DIAGNOSIS — Z1211 Encounter for screening for malignant neoplasm of colon: Secondary | ICD-10-CM

## 2020-01-12 LAB — COMPREHENSIVE METABOLIC PANEL
ALT: 11 U/L (ref 0–35)
AST: 16 U/L (ref 0–37)
Albumin: 4.3 g/dL (ref 3.5–5.2)
Alkaline Phosphatase: 48 U/L (ref 39–117)
BUN: 10 mg/dL (ref 6–23)
CO2: 30 mEq/L (ref 19–32)
Calcium: 9.1 mg/dL (ref 8.4–10.5)
Chloride: 104 mEq/L (ref 96–112)
Creatinine, Ser: 0.97 mg/dL (ref 0.40–1.20)
GFR: 68.62 mL/min (ref >60.00)
Glucose, Bld: 84 mg/dL (ref 70–99)
Potassium: 4.1 mEq/L (ref 3.5–5.1)
Sodium: 139 mEq/L (ref 135–145)
Total Bilirubin: 0.7 mg/dL (ref 0.2–1.2)
Total Protein: 7.7 g/dL (ref 6.0–8.3)

## 2020-01-12 LAB — CBC WITH DIFFERENTIAL/PLATELET
Basophils Absolute: 0 10*3/uL (ref 0.0–0.1)
Basophils Relative: 1.1 % (ref 0.0–3.0)
Eosinophils Absolute: 0.1 10*3/uL (ref 0.0–0.7)
Eosinophils Relative: 1.6 % (ref 0.0–5.0)
HCT: 38.1 % (ref 36.0–46.0)
Hemoglobin: 12.1 g/dL (ref 12.0–15.0)
Lymphocytes Relative: 23.5 % (ref 12.0–46.0)
Lymphs Abs: 1.1 10*3/uL (ref 0.7–4.0)
MCHC: 31.7 g/dL (ref 30.0–36.0)
MCV: 91.2 fl (ref 78.0–100.0)
Monocytes Absolute: 0.6 10*3/uL (ref 0.1–1.0)
Monocytes Relative: 11.8 % (ref 3.0–12.0)
Neutro Abs: 2.9 10*3/uL (ref 1.4–7.7)
Neutrophils Relative %: 62 % (ref 43.0–77.0)
Platelets: 270 10*3/uL (ref 150.0–400.0)
RBC: 4.18 Mil/uL (ref 3.87–5.11)
RDW: 14.3 % (ref 11.5–15.5)
WBC: 4.7 10*3/uL (ref 4.0–10.5)

## 2020-01-12 LAB — LIPID PANEL
Cholesterol: 177 mg/dL (ref 0–200)
HDL: 52.6 mg/dL (ref >39.00)
LDL Cholesterol: 111 mg/dL — ABNORMAL HIGH (ref 0–99)
NonHDL: 124.62
Total CHOL/HDL Ratio: 3
Triglycerides: 70 mg/dL (ref 0.0–149.0)
VLDL: 14 mg/dL (ref 0.0–40.0)

## 2020-01-12 LAB — TSH: TSH: 1.93 u[IU]/mL (ref 0.35–4.50)

## 2020-01-12 LAB — VITAMIN D 25 HYDROXY (VIT D DEFICIENCY, FRACTURES): VITD: 21.59 ng/mL — ABNORMAL LOW (ref 30.00–100.00)

## 2020-01-12 LAB — HEMOGLOBIN A1C: Hgb A1c MFr Bld: 5 % (ref 4.6–6.5)

## 2020-01-12 MED ORDER — POLYSACCHARIDE IRON COMPLEX 150 MG PO CAPS: 150.0000 mg | ORAL_CAPSULE | Freq: Every day | ORAL | 11 refills | Status: AC

## 2020-01-12 NOTE — Progress Notes (Signed)
This visit occurred during the SARS-CoV-2 public health emergency.  Safety protocols were in place, including screening questions prior to the visit, additional usage of staff PPE, and extensive cleaning of exam room while observing appropriate contact time as indicated for disinfecting solutions.    Patient ID: Mallory Green, female  DOB: 03/21/1970, 49 y.o.   MRN: [id number] Patient Care Team    Relationship Specialty Notifications Start End  Natalia Leatherwood, DO PCP - General Family Medicine  08/16/16   Maxie Better, MD Consulting Physician Obstetrics and Gynecology  12/30/18     Chief Complaint  Patient presents with  . Annual Exam    Pt is fasting     Subjective:  Mallory Green is a 49 y.o.  Female  present for CPE. All past medical history, surgical history, allergies, family history, immunizations, medications and social history were updated in the electronic medical record today. All recent labs, ED visits and hospitalizations within the last year were reviewed.  Health maintenance:  Colonoscopy:no fhx, screen at 45. Referred today Mammogram: completed: 01/2019. Breast center. > ordered for 2022 Cervical cancer screening:2020- Dr.[facility] Immunizations: tdapUTD 2016, InfluenzaUTD 2021(encouraged yearly), covid series completed w/ booster. Shingrix series start  after June ok by nurse visit.  Infectious disease screening: HIVcompleted, hep c agreed to screen DEXA:N/A Assistive device: none  Oxygen KVQ:QVZD Patient has a Dental home. Hospitalizations/ED visits: reviewed  IDA:  Pt has a h/o IDA and taking iron supplement a few times a week. Her menstrual cycles are becoming irregular. She skipped November cycle and December cycle was extra heavy. She has seen hematology concerning IDA. She is having hot flashes as well.   Hoarseness:  She reports new onset hoarseness of voice. She states her voice is very raspy and she will lose her voice when trying to  speak. Onset about 3-4 months ago. Sometimes she has "soreness" in her throat- especially if speaking more in a day. She has not tried anything for her symptoms. She denies allergy symptoms or antihistamine use. She denies heartburn or reflux symptoms or PPi use. She does eat a spicy diet  (S. African).She is not a smoker. 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    Depression screen [facility] 2/9 01/12/2020 12/30/2018 08/18/2017 08/16/2016  Decreased Interest 0 0 0 0  Down, Depressed, Hopeless 0 0 0 0  PHQ - 2 Score 0 0 0 0   No flowsheet data found.   Immunization History  Administered Date(s) Administered  . Influenza-Unspecified 10/22/2019  . PFIZER SARS-COV-2 Vaccination 01/16/2019, 02/04/2019, 01/11/2020  . Tdap 01/21/2014   Past Medical History:  Diagnosis Date  . Menorrhagia    No Known Allergies Past Surgical History:  Procedure Laterality Date  . Disc surgery L4  2008   Family History  Problem Relation Age of Onset  . Hypertension Father   . Diabetes Father   . Diabetes Mother   . Heart disease Mother   . Breast cancer Mother 49  . 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    Drinks caffeine. Uses herbal remedies. Takes a daily vitamin.   Wears her seatbelt, wears a bicycle helmet, smoke detectors in the home.   Safe in her relationships.    Allergies as of 01/12/2020   No Known Allergies     Medication List       Accurate as of January 12, 2020  8:49 AM. If you have any questions, ask your nurse or doctor.        iron polysaccharides 150 MG capsule Commonly known as: NIFEREX Take 1 capsule (150 mg total) by mouth daily.   MULTIVITAMIN ADULT PO Take by mouth.   vitamin C 100 MG tablet Take 100 mg by mouth daily.   Vitamin D (Cholecalciferol) 25 MCG (1000 UT) Caps Take by mouth.   Zinc 25 MG Tabs Take by mouth.       All past medical history, surgical history, allergies, family  history, immunizations andmedications were updated in the EMR today and reviewed under the history and medication portions of their EMR.     No results found for this or any previous visit (from the past 2160 hour(s)).  MM 3D SCREEN BREAST BILATERAL  Result Date: 02/22/2019 CLINICAL DATA:  Screening. EXAM: DIGITAL SCREENING BILATERAL MAMMOGRAM WITH TOMO AND CAD COMPARISON:  Previous exam(s). ACR Breast Density Category c: The breast tissue is heterogeneously dense, which may obscure small masses. FINDINGS: There are no findings suspicious for malignancy. Images were processed with CAD. IMPRESSION: No mammographic evidence of malignancy. A result letter of this screening mammogram will be mailed directly to the patient. RECOMMENDATION: Screening mammogram in one year. (Code:SM-B-01Y) BI-RADS CATEGORY  1: Negative. Electronically Signed   By: Sande Brothers M.D.   On: 02/22/2019 15:12     ROS: 14 pt review of systems performed and negative (unless mentioned in an HPI)  Objective: BP 111/74   Pulse 75   Temp 98.4 F (36.9 C) (Oral)   Ht 5' 5.5" (1.664 m)   Wt 158 lb (71.7 kg)   SpO2 99%   BMI 25.89 kg/m  Gen: Afebrile. No acute distress. Nontoxic in appearance, well-developed, well-nourished,  Pleasant female.  HENT: AT. [place]. Bilateral TM visualized and normal in appearance, normal external auditory canal. MMM, no oral lesions, adequate dentition. Bilateral nares within normal limits. Throat without erythema, ulcerations or exudates. no Cough on exam,  hoarseness present on exam. Eyes:Pupils Equal Round Reactive to light, Extraocular movements intact,  Conjunctiva without redness, discharge or icterus. Neck/lymp/endocrine: Supple,no lymphadenopathy, no thyromegaly CV: RRR no murmur, no edema, +2/4 P posterior tibialis pulses.  Chest: CTAB, no wheeze, rhonchi or crackles. normal Respiratory effort. good Air movement. Abd: Soft. flat. NTND. BS present. no Masses palpated. No hepatosplenomegaly.  No rebound tenderness or guarding. Skin: no rashes, purpura or petechiae. Warm and well-perfused. Skin intact. Neuro/Msk: Normal gait. PERLA. EOMi. Alert. Oriented x3.  Cranial nerves II through XII intact. Muscle strength 5/5 upper/lower extremity. DTRs equal bilaterally. Psych: Normal affect, dress and demeanor. Normal speech. Normal thought content and judgment.  No exam data present  Assessment/plan: MAGAN WINNETT is a 49 y.o. female present for CPE  Menorrhagia with irregular cycle - TSH Hypocalcemia Taking 1000 u vit d daily.  - Comprehensive metabolic panel - Vitamin D (25 hydroxy) Lipid screening - Lipid panel Diabetes mellitus screening - Hemoglobin A1c Iron deficiency anemia, unspecified iron deficiency anemia type/Abnormal iron saturation - taking iron supplement a few times a week- refilled for her - Iron, TIBC and Ferritin Panel - CBC with Differential/Platelet Encounter for screening mammogram for malignant neoplasm of breast - MM Digital Screening; Future Need for hepatitis C screening test - Hepatitis C antibody Colon cancer screening - Ambulatory referral to Gastroenterology Hoarseness: - new problem discussed LPR vs allergies vs vocal cord etiology> pt elected to be referred to ENT prior to trial of PPI.  Referral placed for her today Routine general medical examination at a health care facility Patient was encouraged to exercise greater than 150 minutes a week. Patient was encouraged to choose a diet filled with fresh fruits and vegetables, and lean meats. AVS provided to patient today for education/recommendation on gender specific health and safety maintenance. Colonoscopy:no fhx, screen at 45. Referred today Mammogram: completed: 01/2019. Breast center. > ordered for 2022 Cervical cancer screening:2020- Dr.C[facility]ins [facility] Immunizations: tdapUTD 2016, InfluenzaUTD 2021(encouraged yearly), covid series completed w/ booster. Shingrix series start  after June  ok by nurse visit.  Infectious disease screening: HIVcompleted, hep c agreed to screen DEXA:N/A  Return in about 1 year (around 01/11/2021) for CPE (30 min).   Orders Placed This Encounter  Procedures  . MM Digital Screening  . CBC with Differential/Platelet  . Comprehensive metabolic panel  . Hemoglobin A1c  . Lipid panel  . TSH  . Hepatitis C antibody  . Iron, TIBC and Ferritin Panel  . Vitamin D (25 hydroxy)  . Ambulatory referral to Gastroenterology  . Ambulatory referral to ENT   No orders of the defined types were placed in this encounter.   Referral Orders     Ambulatory referral to Gastroenterology     Ambulatory referral to ENT   Electronically signed by: Felix Pacini, DO [facility] [place]

## 2020-01-12 NOTE — Patient Instructions (Addendum)
Remember:  - shingrix series start after 50th birthday- make nurse appt - referred for colonoscopy, mammogram ordered and I wil ll refer you to ENT for your throat.   Health Maintenance, Female Adopting a healthy lifestyle and getting preventive care are important in promoting health and wellness. Ask your health care provider about:  The right schedule for you to have regular tests and exams.  Things you can do on your own to prevent diseases and keep yourself healthy. What should I know about diet, weight, and exercise? Eat a healthy diet   Eat a diet that includes plenty of vegetables, fruits, low-fat dairy products, and lean protein.  Do not eat a lot of foods that are high in solid fats, added sugars, or sodium. Maintain a healthy weight Body mass index (BMI) is used to identify weight problems. It estimates body fat based on height and weight. Your health care provider can help determine your BMI and help you achieve or maintain a healthy weight. Get regular exercise Get regular exercise. This is one of the most important things you can do for your health. Most adults should:  Exercise for at least 150 minutes each week. The exercise should increase your heart rate and make you sweat (moderate-intensity exercise).  Do strengthening exercises at least twice a week. This is in addition to the moderate-intensity exercise.  Spend less time sitting. Even light physical activity can be beneficial. Watch cholesterol and blood lipids Have your blood tested for lipids and cholesterol at 49 years of age, then have this test every 5 years. Have your cholesterol levels checked more often if:  Your lipid or cholesterol levels are high.  You are older than 49 years of age.  You are at high risk for heart disease. What should I know about cancer screening? Depending on your health history and family history, you may need to have cancer screening at various ages. This may include  screening for:  Breast cancer.  Cervical cancer.  Colorectal cancer.  Skin cancer.  Lung cancer. What should I know about heart disease, diabetes, and high blood pressure? Blood pressure and heart disease  High blood pressure causes heart disease and increases the risk of stroke. This is more likely to develop in people who have high blood pressure readings, are of African descent, or are overweight.  Have your blood pressure checked: ? Every 3-5 years if you are 56-38 years of age. ? Every year if you are 87 years old or older. Diabetes Have regular diabetes screenings. This checks your fasting blood sugar level. Have the screening done:  Once every three years after age 20 if you are at a normal weight and have a low risk for diabetes.  More often and at a younger age if you are overweight or have a high risk for diabetes. What should I know about preventing infection? Hepatitis B If you have a higher risk for hepatitis B, you should be screened for this virus. Talk with your health care provider to find out if you are at risk for hepatitis B infection. Hepatitis C Testing is recommended for:  Everyone born from [birth year] through 1965.  Anyone with known risk factors for hepatitis C. Sexually transmitted infections (STIs)  Get screened for STIs, including gonorrhea and chlamydia, if: ? You are sexually active and are younger than 49 years of age. ? You are older than 49 years of age and your health care provider tells you that you are at risk  for this type of infection. ? Your sexual activity has changed since you were last screened, and you are at increased risk for chlamydia or gonorrhea. Ask your health care provider if you are at risk.  Ask your health care provider about whether you are at high risk for HIV. Your health care provider may recommend a prescription medicine to help prevent HIV infection. If you choose to take medicine to prevent HIV, you should first get  tested for HIV. You should then be tested every 3 months for as long as you are taking the medicine. Pregnancy  If you are about to stop having your period (premenopausal) and you may become pregnant, seek counseling before you get pregnant.  Take 400 to 800 micrograms (mcg) of folic acid every day if you become pregnant.  Ask for birth control (contraception) if you want to prevent pregnancy. Osteoporosis and menopause Osteoporosis is a disease in which the bones lose minerals and strength with aging. This can result in bone fractures. If you are 67 years old or older, or if you are at risk for osteoporosis and fractures, ask your health care provider if you should:  Be screened for bone loss.  Take a calcium or vitamin D supplement to lower your risk of fractures.  Be given hormone replacement therapy (HRT) to treat symptoms of menopause. Follow these instructions at home: Lifestyle  Do not use any products that contain nicotine or tobacco, such as cigarettes, e-cigarettes, and chewing tobacco. If you need help quitting, ask your health care provider.  Do not use street drugs.  Do not share needles.  Ask your health care provider for help if you need support or information about quitting drugs. Alcohol use  Do not drink alcohol if: ? Your health care provider tells you not to drink. ? You are pregnant, may be pregnant, or are planning to become pregnant.  If you drink alcohol: ? Limit how much you use to 0-1 drink a day. ? Limit intake if you are breastfeeding.  Be aware of how much alcohol is in your drink. In the [place] one drink equals one 12 oz bottle of beer (355 mL), one 5 oz glass of wine (148 mL), or one 1 oz glass of hard liquor (44 mL). General instructions  Schedule regular health, dental, and eye exams.  Stay current with your vaccines.  Tell your health care provider if: ? You often feel depressed. ? You have ever been abused or do not feel safe at  home. Summary  Adopting a healthy lifestyle and getting preventive care are important in promoting health and wellness.  Follow your health care provider's instructions about healthy diet, exercising, and getting tested or screened for diseases.  Follow your health care provider's instructions on monitoring your cholesterol and blood pressure. This information is not intended to replace advice given to you by your health care provider. Make sure you discuss any questions you have with your health care provider. Document Revised: 12/31/2017 Document Reviewed: 12/31/2017 Elsevier Patient Education  2020 [employer].

## 2020-01-13 LAB — HEPATITIS C ANTIBODY
Hepatitis C Ab: NONREACTIVE
SIGNAL TO CUT-OFF: 0.01 (ref <1.00)

## 2020-01-13 LAB — IRON,TIBC AND FERRITIN PANEL
%SAT: 23 % (calc) (ref 16–45)
Ferritin: 10 ng/mL — ABNORMAL LOW (ref 16–232)
Iron: 81 ug/dL (ref 40–190)
TIBC: 345 mcg/dL (calc) (ref 250–450)

## 2020-02-08 ENCOUNTER — Ambulatory Visit (INDEPENDENT_AMBULATORY_CARE_PROVIDER_SITE_OTHER): Payer: 59 | Admitting: Otolaryngology

## 2020-02-08 ENCOUNTER — Other Ambulatory Visit: Payer: Self-pay

## 2020-02-08 ENCOUNTER — Encounter (INDEPENDENT_AMBULATORY_CARE_PROVIDER_SITE_OTHER): Payer: Self-pay | Admitting: Otolaryngology

## 2020-02-08 VITALS — Temp 96.6°F

## 2020-02-08 DIAGNOSIS — J382 Nodules of vocal cords: Secondary | ICD-10-CM

## 2020-02-08 DIAGNOSIS — K219 Gastro-esophageal reflux disease without esophagitis: Secondary | ICD-10-CM | POA: Diagnosis not present

## 2020-02-08 NOTE — Progress Notes (Signed)
HPI: Mallory Green is a 50 y.o. female who presents is referred by her PCP for evaluation of hoarseness.  Patient has complained of hoarseness for the past year and it seems to have gotten worse over the past 6 months.  She does not smoke.  She works as a Engineer, civil (consulting) at [employer].  She has had some choking sensation when she swallows at night. She has a mild raspy voice in the office today..  Past Medical History:  Diagnosis Date  . Menorrhagia    Past Surgical History:  Procedure Laterality Date  . Disc surgery L4  2008   Social History   Socioeconomic History  . Marital status: Married    Spouse name: Not on file  . Number of children: 2  . Years of education: MSRN  . Highest education level: Not on file  Occupational History  . Occupation: Charity fundraiser  Tobacco Use  . Smoking status: Never Smoker  . Smokeless tobacco: Never Used  Vaping Use  . Vaping Use: Never used  Substance and Sexual Activity  . Alcohol use: Yes  . Drug use: No  . Sexual activity: Yes    Partners: Male    Birth control/protection: Condom  Other Topics Concern  . 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    Drinks caffeine. Uses herbal remedies. Takes a daily vitamin.   Wears her seatbelt, wears a bicycle helmet, smoke detectors in the home.   Safe in her relationships.   Social Determinants of Health   Financial Resource Strain: Not on file  Food Insecurity: Not on file  Transportation Needs: Not on file  Physical Activity: Not on file  Stress: Not on file  Social Connections: Not on file   Family History  Problem Relation Age of Onset  . Hypertension Father   . Diabetes Father   . Diabetes Mother   . Heart disease Mother   . Breast cancer Mother 8  . Breast cancer Sister 76   No Known Allergies Prior to Admission medications   Medication Sig Start Date End Date Taking? Authorizing Provider  Ascorbic Acid (VITAMIN C) 100 MG tablet Take 100 mg by mouth daily.     [provider]  iron polysaccharides (NIFEREX) 150 MG capsule Take 1 capsule (150 mg total) by mouth daily. 01/12/20   Kuneff, Renee A, DO  Multiple Vitamins-Minerals (MULTIVITAMIN ADULT PO) Take by mouth.    [provider]  Vitamin D, Cholecalciferol, 25 MCG (1000 UT) CAPS Take by mouth.    [provider]  Zinc 25 MG TABS Take by mouth.    [provider]     Positive ROS: Otherwise negative  All other systems have been reviewed and were otherwise negative with the exception of those mentioned in the HPI and as above.  Physical Exam: Constitutional: Alert, well-appearing, no acute distress Ears: External ears without lesions or tenderness. Ear canals are clear bilaterally with intact, clear TMs.  Nasal: External nose without lesions. Septum slightly deviated to the left with mild rhinitis and clear mucus discharge.. Clear nasal passages otherwise. Oral: Lips and gums without lesions. Tongue and palate mucosa without lesions. Posterior oropharynx clear.  Tonsils are moderate in size and symmetric 2+ bilaterally. Fiberoptic laryngoscopy was performed through the right nostril.  The nasopharynx was clear.  The base of tongue vallecula and epiglottis were normal.  On evaluation of the vocal cords patient had bilateral vocal cord nodules at  the anterior one third of the vocal cords with symmetric vocal cord mobility.  In addition she had moderate arytenoid edema and slight erythema consistent with laryngeal pharyngeal reflux disease. Neck: No palpable adenopathy or masses.  No palpable thyroid nodules noted. Respiratory: Breathing comfortably  Skin: No facial/neck lesions or rash noted.  Laryngoscopy  Date/Time: 02/08/2020 4:00 PM Performed by: Drema Halon, MD Authorized by: Drema Halon, MD   Consent:    Consent obtained:  Verbal   Consent given by:  Patient Procedure details:    Indications: hoarseness, dysphagia, or aspiration      Medication:  Afrin   Instrument: flexible fiberoptic laryngoscope     Scope location: right nare   Sinus:    Right nasopharynx: normal   Mouth:    Oropharynx: normal     Vallecula: normal     Base of tongue: normal     Epiglottis: normal   Throat:    True vocal cords: normal   Comments:     On fiberoptic laryngoscopy patient has bilateral vocal cord nodules.  She also has moderate arytenoid edema and mild erythema consistent with laryngeal pharyngeal reflux.    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 Findings also consistent with laryngeal pharyngeal reflux disease.  Plan: Prescribed omeprazole 40 mg daily before dinner for the next 2 months. Discussed with her concerning her voice rest and and the nodules should subside and voice improve. She will follow-up in 6 weeks for recheck.  If nodules persist consider further evaluation with speech therapy.   Narda Bonds, MD   CC:

## 2020-03-11 ENCOUNTER — Ambulatory Visit
Admission: RE | Admit: 2020-03-11 | Discharge: 2020-03-11 | Disposition: A | Discharge status: Home / Self Care | Payer: 59 | Source: Ambulatory Visit | Attending: Family Medicine | Admitting: Family Medicine

## 2020-03-11 ENCOUNTER — Other Ambulatory Visit: Payer: Self-pay

## 2020-03-11 DIAGNOSIS — Z1231 Encounter for screening mammogram for malignant neoplasm of breast: Secondary | ICD-10-CM

## 2020-03-15 ENCOUNTER — Telehealth: Payer: Self-pay

## 2020-03-15 NOTE — Telephone Encounter (Signed)
Pt was contacted regarding results(mammogram) and advised of those results. She did mention she has received several calls from [facility] regarding scheduling an appointment but unable to return their call due to her work schedule. Referral placed on 01/12/20, she will call their office to see if she can still be seen and contact us if a new referral is needed.

## 2020-03-20 ENCOUNTER — Other Ambulatory Visit: Payer: Self-pay

## 2020-03-20 ENCOUNTER — Ambulatory Visit (INDEPENDENT_AMBULATORY_CARE_PROVIDER_SITE_OTHER): Payer: 59 | Admitting: Otolaryngology

## 2020-03-20 DIAGNOSIS — J382 Nodules of vocal cords: Secondary | ICD-10-CM | POA: Diagnosis not present

## 2020-03-20 DIAGNOSIS — R490 Dysphonia: Secondary | ICD-10-CM

## 2020-03-20 DIAGNOSIS — K219 Gastro-esophageal reflux disease without esophagitis: Secondary | ICD-10-CM

## 2020-03-20 NOTE — Progress Notes (Signed)
HPI: Mallory Green is a 50 y.o. female who returns today for evaluation of hoarseness and history of reflux symptoms.  On previous evaluation 5 or 6 weeks ago she had small vocal cord nodules as well as symptoms of laryngeal pharyngeal reflux.  She has been prescribed omeprazole but has not been taking this regularly but probably 4 times a week because of her schedule.  She works as a Press photographer at [employer].  She still has persistent hoarseness. She does not smoke.Marland Kitchen  Past Medical History:  Diagnosis Date  . Menorrhagia    Past Surgical History:  Procedure Laterality Date  . Disc surgery L4  2008   Social History   Socioeconomic History  . Marital status: Married    Spouse name: Not on file  . Number of children: 2  . Years of education: MSRN  . Highest education level: Not on file  Occupational History  . Occupation: Charity fundraiser  Tobacco Use  . Smoking status: Never Smoker  . Smokeless tobacco: Never Used  Vaping Use  . Vaping Use: Never used  Substance and Sexual Activity  . Alcohol use: Yes  . Drug use: No  . Sexual activity: Yes    Partners: Male    Birth control/protection: Condom  Other Topics Concern  . 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    Drinks caffeine. Uses herbal remedies. Takes a daily vitamin.   Wears her seatbelt, wears a bicycle helmet, smoke detectors in the home.   Safe in her relationships.   Social Determinants of Health   Financial Resource Strain: Not on file  Food Insecurity: Not on file  Transportation Needs: Not on file  Physical Activity: Not on file  Stress: Not on file  Social Connections: Not on file   Family History  Problem Relation Age of Onset  . Hypertension Father   . Diabetes Father   . Diabetes Mother   . Heart disease Mother   . Breast cancer Mother 54  . Breast cancer Sister 32   No Known Allergies Prior to Admission medications   Medication Sig Start Date End Date Taking? Authorizing  Provider  Ascorbic Acid (VITAMIN C) 100 MG tablet Take 100 mg by mouth daily.    [provider]  iron polysaccharides (NIFEREX) 150 MG capsule Take 1 capsule (150 mg total) by mouth daily. 01/12/20   Kuneff, Renee A, DO  Multiple Vitamins-Minerals (MULTIVITAMIN ADULT PO) Take by mouth.    [provider]  Vitamin D, Cholecalciferol, 25 MCG (1000 UT) CAPS Take by mouth.    [provider]  Zinc 25 MG TABS Take by mouth.    [provider]     Positive ROS: Otherwise negative  All other systems have been reviewed and were otherwise negative with the exception of those mentioned in the HPI and as above.  Physical Exam: Constitutional: Alert, well-appearing, no acute distress Ears: External ears without lesions or tenderness. Ear canals are clear bilaterally with intact, clear TMs.  Nasal: External nose without lesions. Septum slight deformity to the left.. Clear nasal passages otherwise. Oral: Lips and gums without lesions. Tongue and palate mucosa without lesions. Posterior oropharynx clear. Fiberoptic laryngoscopy was performed to the right nostril.  A fiberoptic laryngoscopy the nasopharynx was clear.  The base of tongue vallecula and epiglottis were normal.  On close evaluation of the vocal cords she has small anterior one third vocal cord nodules with the  left slightly more prominent than the right.  The previous edema of the arytenoid mucosa appears better. Neck: No palpable adenopathy or masses Respiratory: Breathing comfortably  Skin: No facial/neck lesions or rash noted.  Laryngoscopy  Date/Time: 03/20/2020 6:14 PM Performed by: Drema Halon, MD Authorized by: Drema Halon, MD   Consent:    Consent obtained:  Verbal   Consent given by:  Patient Procedure details:    Indications: hoarseness, dysphagia, or aspiration     Medication:  Afrin   Instrument: flexible fiberoptic laryngoscope     Scope location: right nare    Sinus:    Right nasopharynx: normal   Mouth:    Vallecula: normal     Base of tongue: normal     Epiglottis: normal   Throat:    True vocal cords: normal   Comments:     Patient with small vocal cord nodules on both vocal cords at the anterior one third of the vocal cords.  Vocal cords otherwise have symmetric mobility.    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 Symptoms also of laryngeal pharyngeal reflux.  Plan: Recommended regular use of omeprazole 40 mg daily before dinner sometime between 3 and 6.  Also recommended elevation of head of bed at night and she sleeps and not eating within 2 to 3 hours of going to bed. Concerning the small vocal cord nodules would recommend evaluation with speech therapy to see if they can help with the vocal cord nodules.   Narda Bonds, MD

## 2020-04-20 ENCOUNTER — Other Ambulatory Visit: Payer: Self-pay

## 2020-04-20 ENCOUNTER — Ambulatory Visit: Payer: 59 | Admitting: Family Medicine

## 2020-04-20 DIAGNOSIS — Z0289 Encounter for other administrative examinations: Secondary | ICD-10-CM

## 2020-04-21 ENCOUNTER — Ambulatory Visit (INDEPENDENT_AMBULATORY_CARE_PROVIDER_SITE_OTHER): Payer: 59 | Admitting: Family Medicine

## 2020-04-21 ENCOUNTER — Encounter: Payer: Self-pay | Admitting: Family Medicine

## 2020-04-21 VITALS — BP 105/69 | HR 79 | Temp 97.9°F | Ht 65.5 in | Wt 160.0 lb

## 2020-04-21 DIAGNOSIS — M25562 Pain in left knee: Secondary | ICD-10-CM

## 2020-04-21 DIAGNOSIS — E559 Vitamin D deficiency, unspecified: Secondary | ICD-10-CM | POA: Insufficient documentation

## 2020-04-21 DIAGNOSIS — R232 Flushing: Secondary | ICD-10-CM

## 2020-04-21 DIAGNOSIS — Z1211 Encounter for screening for malignant neoplasm of colon: Secondary | ICD-10-CM

## 2020-04-21 MED ORDER — CITALOPRAM HYDROBROMIDE 10 MG PO TABS: 10.0000 mg | ORAL_TABLET | Freq: Every day | ORAL | 3 refills | Status: AC

## 2020-04-21 NOTE — Patient Instructions (Signed)
Start celexa 10 mg daily for hot flashes.    Meniscus Tear  A meniscus tear is a knee injury that happens when a piece of the meniscus is torn. The meniscus is a thick, rubbery, wedge-shaped piece of cartilage in the knee. Each knee has two menisci sitting between the upper bone (femur) and lower bone (tibia) that form the knee joint. Each meniscus acts as a shock absorber for the knee. A torn meniscus is a common knee injury, ranging from mild to severe. Surgery may be needed to repair a severe tear. What are the causes? This condition may be caused by kneeling, squatting, twisting, or pivoting movements. Sports-related injuries are the most common cause, often resulting from:  Running and stopping suddenly.  Changing direction.  Being tackled or knocked off your feet.  Lifting or carrying heavy weights. As people get older, their menisci get thinner and weaker. Tears can happen more easily in older people, for example, when climbing stairs. What increases the risk? You are more likely to develop this condition if you:  Play contact sports.  Have a job that requires kneeling or squatting.  Are female.  Are over 52 years old. What are the signs or symptoms? Symptoms of this condition include:  Knee pain, especially at the side of the knee joint. You may feel pain immediately after injury, or hear a pop and feel pain later.  A feeling that your knee is clicking, catching, locking, or giving way (weakness, instability).  Not being able to fully bend or extend your knee.  Bruising or swelling in your knee. How is this diagnosed? This condition may be diagnosed based on your symptoms and a physical exam. You may also have tests, such as:  X-rays.  MRI.  Arthroscopy. This is a procedure to look inside your knee with a narrow surgical telescope. You may be referred to a knee specialist (orthopedic surgeon). How is this treated? Treatment for this injury depends on the  severity of the tear. Treatment for a mild tear may include:  Rest.  Medicine to reduce pain and swelling, usually a nonsteroidal anti-inflammatory drug (NSAID), like ibuprofen.  A knee brace, sleeve, or wrap.  Using crutches or a walker to keep weight off your knee and help with walking.  Exercises to strengthen your knee (physical therapy). You may need surgery if you have a severe tear or if other treatments fail. Follow these instructions at home: If you have a brace, sleeve, or wrap:  Wear it, as told by your health care provider. Remove it, only as told by your health care provider.  Loosen the brace, sleeve, or wrap if your toes tingle, become numb, or turn cold and blue.  Keep the brace, sleeve, or wrap clean.  If the brace, sleeve, or wrap is not waterproof: ? Do not let it get wet. ? Cover it with a watertight covering when you take a bath or shower. Managing pain, stiffness, and swelling  Take over-the-counter and prescription medicines only as told by your health care provider.  If directed, put ice on your knee. To do this: ? If you have a removable brace, sleeve, or wrap, remove it as told by your health care provider. ? Put ice in a plastic bag. ? Place a towel between your skin and the bag. ? Leave the ice on for 20 minutes, 2-3 times per day. ? Remove the ice if your skin turns bright red. This is very important. If you cannot feel pain,  heat, or cold, you have a greater risk of damage to the area.  Move your toes often to reduce stiffness and swelling.  Raise (elevate) the injured area above the level of your heart while you are sitting or lying down.   Activity  Do not use the injured limb to support your body weight until your health care provider says that you can. Use crutches or a walker as told by your health care provider.  Return to your normal activities as told by your health care provider. Ask your health care provider what activities are safe  for you.  Perform range-of-motion exercises only as told by your health care provider.  Begin doing exercises to strengthen your knee and leg muscles only as told by your health care provider. After you recover, your health care provider may recommend these exercises to help prevent another injury. General instructions  Use a knee brace, sleeve, or wrap as told by your health care provider.  Ask your health care provider when it is safe to drive if you have a brace, sleeve, or wrap on your knee.  Do not use any products that contain nicotine or tobacco, such as cigarettes, e-cigarettes, and chewing tobacco. If you need help quitting, ask your health care provider.  Ask your health care provider if the medicine prescribed to you: ? Requires you to avoid driving or using heavy machinery. ? Can cause constipation. You may need to take these actions to prevent or treat constipation:  Drink enough fluid to keep your urine pale yellow.  Take over-the-counter or prescription medicines.  Eat foods that are high in fiber, such as beans, whole grains, and fresh fruits and vegetables.  Limit foods that are high in fat and processed sugars, such as fried or sweet foods.  Keep all follow-up visits. This is important. Contact a health care provider if:  You have a fever.  Your knee becomes red, tender, or swollen.  Your pain medicine is not controlling your pain.  Your symptoms get worse or do not improve after 2 weeks of home care. Summary  A meniscus tear is a knee injury that happens when a piece of the meniscus is torn.  Treatment for this injury depends on the severity of the tear. You may need surgery if you have a severe tear or if other treatments fail.  Rest, ice, and raise (elevate) your injured knee, as told by your health care provider. This will help lessen pain and swelling.  Contact a health care provider if you have new symptoms, your symptoms worsen, or they do not  improve after 2 weeks of home care.  Keep all follow-up visits. This is important. This information is not intended to replace advice given to you by your health care provider. Make sure you discuss any questions you have with your health care provider. Document Revised: 05/20/2019 Document Reviewed: 05/20/2019 Elsevier Patient Education  2021 [employer] Inc[employer]  Meniscus Tear, Phase I Rehab After Surgery Ask your health care provider which exercises are safe for you. Do exercises exactly as told by your health care provider and adjust them as directed. It is normal to feel mild stretching, pulling, tightness, or discomfort as you do these exercises. Stop right away if you feel sudden pain or your pain gets worse. Do not begin these exercises until told by your health care provider. If told by your health care provider, wear your brace while you do these exercises. Stretching and range-of-motion exercises These exercises warm  up your muscles and joints and improve the movement and flexibility of your knee. These exercises also help to relieve pain and stiffness. You may be asked to limit your range of motion if you had a meniscus repair. Talk with your health care provider about these restrictions. Passive knee flexion, supine You do this exercise while lying on your back (supine position). 1. Start this exercise in one of these positions: ? Lying on the floor in front of an open doorway, with your left / right heel and foot lightly touching the wall. ? Lying on a bed with both of your feet on the wall or headboard. 2. Without using any effort (passive), allow gravity to slide your foot down the wall slowly until you feel a gentle stretch (flexion) in the front of your left / right knee, or until your knee reaches the angle that your health care provider tells you. 3. Hold this stretch for __________ seconds. 4. Return your leg to the starting position, using your healthy leg to do the work or to help  if needed. Repeat __________ times. Complete this exercise __________ times a day.   Passive knee extension, sitting 1. Sit with your left / right heel propped up on a chair, a coffee table, or a footstool. Do not have anything under your knee to support it. 2. Without using any effort (passive), allow your leg muscles to relax, letting gravity straighten out your knee (extension). Do not let your knee turn inward. You should feel a stretch behind your left / right knee. 3. If told by your health care provider, increase the stretch by placing a __________ lb / kg weight on your thigh, just above your kneecap. 4. Hold this position for __________ seconds. Repeat __________ times. Complete this exercise __________ times a day.   Passive gastrocnemius stretch 1. Sit on the floor with your left / right leg extended. 2. Loop a belt or towel around the ball of your left / right foot. The ball of your foot is on the walking surface, right under your toes. 3. Keep your left / right ankle and foot relaxed and keep your knee straight. Without moving the foot yourself (passive), use the belt or towel to pull your foot and ankle toward you. You should feel a gentle stretch in your calf or behind your knee. 4. Hold this position for __________ seconds. Repeat __________ times. Complete this exercise __________ times a day.   Strengthening exercises These exercises build strength and endurance in your knee. Endurance is the ability to use your muscles for a long time, even after they get tired. Quadriceps, isometric This exercise strengthens the muscles in front of your thigh (quadriceps) without moving your knee joint (isometric). 1. Lie on your back with your left / right leg extended and your other knee bent. 2. Slowly tense the muscles in the front of your left / right thigh. You should see your kneecap slide up toward your hip or see increased dimpling just above the knee. This motion will push the back of  your knee toward the floor. 3. For __________ seconds, hold the muscle as tight as you can without increasing your pain. 4. Relax the muscles slowly and completely. Repeat __________ times. Complete this exercise __________ times a day.   Straight leg raises, supine This exercise strengthens the muscles in the front of your thigh (quadriceps). 1. Lie on your back (supine position) with your left / right leg extended and your other knee  bent. 2. Tense the muscles in the front of your left / right thigh. You should see your kneecap slide up toward your hip or see increased dimpling just above the knee. 3. Keep these muscles tight as you raise your leg 4-6 inches (10-15 cm) off the floor. Do not let your knee bend. 4. Hold this position for __________ seconds. 5. Keep these muscles tense as you lower your leg. 6. Relax these muscles slowly and completely after each repetition. Repeat __________ times. Complete this exercise __________ times a day. Straight leg raises, side-lying This exercise strengthens the muscles that rotate the leg at the hip and move it away from your body (hip abductors). 1. Lie on your side with your left / right leg in the top position. Lie so your head, shoulder, hip, and knee line up. You may bend your bottom knee to help you keep your balance. 2. Turn your hips slightly forward so your hips are stacked directly over each other and your left / right knee is facing forward. 3. Leading with your heel, lift your top leg 4-6 inches (10-15 cm), keeping your toes pointed straight ahead. ? Do not let your foot drift forward. ? Do not let your knee turn toward the ceiling. 4. Hold this position for __________ seconds. 5. Slowly lower your leg to the starting position. 6. Allow your muscles to relax completely after each repetition. Repeat __________ times. Complete this exercise __________ times a day.   This information is not intended to replace advice given to you by your  health care provider. Make sure you discuss any questions you have with your health care provider. Document Revised: 05/10/2019 Document Reviewed: 05/10/2019 Elsevier Patient Education  2021 [employer].

## 2020-04-21 NOTE — Progress Notes (Signed)
This visit occurred during the SARS-CoV-2 public health emergency.  Safety protocols were in place, including screening questions prior to the visit, additional usage of staff PPE, and extensive cleaning of exam room while observing appropriate contact time as indicated for disinfecting solutions.    Mallory Green , 24-Feb-1970, 50 y.o., female MRN: [id number] Patient Care Team    Relationship Specialty Notifications Start End  Natalia Leatherwood, DO PCP - General Family Medicine  08/16/16   Maxie Better, MD Consulting Physician Obstetrics and Gynecology  12/30/18     Chief Complaint  Patient presents with  . Hot Flashes    Pt c/o hot flashes x 3 mos;  . Knee Pain    Pt c/o sharp knee pain with certain movements x 2 mos     Subjective: Pt presents for an OV with complaints of  Hot flashes Patient had hot flashes since early 2021.  Her menstrual cycles are irregular skipping a few months at a time.  Her thyroid panel has been normal.  She has a family history of breast cancer.  She would like to try medication to help with her hot flashes that is not estrogen derived.  Acute pain of left knee Patient reports a few months ago she squatted down and on standing she felt a pain.  She remembers it being painful and walking with a little bit of a limp for a couple of days but nothing that was concerning to her.  Now she notices with certain movements which she cannot put her finger on she will receive a sharp pain in her left medial knee that only lasts a few seconds.  This does not occur daily.  She has had no prior knee surgery or injuries in the past.  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 Uses herbal remedies. Takes a daily vitamin.   Wears her  seatbelt, wears a bicycle helmet, smoke detectors in the home.   Safe in her relationships.   Past Medical History:  Diagnosis Date  . Menorrhagia    Past Surgical History:  Procedure Laterality Date  . Disc surgery L4  2008   Family History  Problem Relation Age of Onset  . Hypertension Father   . Diabetes Father   . Diabetes Mother   . Heart disease Mother   . Breast cancer Mother 54  . Breast cancer Sister 46   Allergies as of 04/21/2020   No Known Allergies     Medication List       Accurate as of April 21, 2020 11:12 AM. If you have any questions, ask your nurse or doctor.        iron polysaccharides 150 MG capsule Commonly known as: NIFEREX Take 1 capsule (150 mg total) by mouth daily.   MULTIVITAMIN ADULT PO Take by mouth.   omeprazole 40 MG capsule Commonly known as: PRILOSEC Take 40 mg by mouth daily.   vitamin C 100 MG tablet Take 100 mg by mouth daily.   Vitamin D (Cholecalciferol) 25 MCG (1000 UT) Caps Take by mouth.   Zinc 25 MG Tabs Take by mouth.       All past medical history, surgical  history, allergies, family history, immunizations andmedications were updated in the EMR today and reviewed under the history and medication portions of their EMR.     ROS: Negative, with the exception of above mentioned in HPI   Objective:  BP 105/69   Pulse 79   Temp 97.9 F (36.6 C) (Oral)   Ht 5' 5.5" (1.664 m)   Wt 160 lb (72.6 kg)   LMP 03/07/2020   SpO2 98%   BMI 26.22 kg/m  Body mass index is 26.22 kg/m. Gen: Afebrile. No acute distress. Nontoxic in appearance, well developed, well nourished.  HENT: AT. [place] Eyes:Pupils Equal Round Reactive to light, Extraocular movements intact,  Conjunctiva without redness, discharge or icterus. MSK: Left knee without erythema.  Full range of motion.  Tenderness to palpation medial joint line.  No ligament laxity.  Discomfort with Apley grind.  Neurovascular intact distally. Neuro: Normal gait. PERLA.  EOMi. Alert. Oriented x3  Muscle strength 5/5 BLE extremity.    No exam data present No results found. No results found for this or any previous visit (from the past 24 hour(s)).  Assessment/Plan: Mallory Green is a 50 y.o. female present for OV for  Hot flashes Discussed options with her today and she is agreeable to start Celexa 10 mg daily. Follow-up in 6 weeks, may need to taper dose at that time  Colon cancer screening - re-entered referral and pt encouraged to call.  - Ambulatory referral to Gastroenterology  Acute pain of left knee Possible meniscal injury 2 months ago.  She is tender over the medial meniscus on exam today. Encouraged her to purchase a neoprene compression sleeve for her left knee to wear during working hours and exercise. If symptoms are not improving, or worsening at her follow-up appointment we can consider orthopedic referral at that time.  She would like to wait for now to see if she continues to improve.   Reviewed expectations re: course of current medical issues.  Discussed self-management of symptoms.  Outlined signs and symptoms indicating need for more acute intervention.  Patient verbalized understanding and all questions were answered.  Patient received an After-Visit Summary.    No orders of the defined types were placed in this encounter.  No orders of the defined types were placed in this encounter.  Referral Orders  No referral(s) requested today     Note is dictated utilizing voice recognition software. Although note has been proof read prior to signing, occasional typographical errors still can be missed. If any questions arise, please do not hesitate to call for verification.   electronically signed by:  Felix Pacini, DO  [facility] [place]

## 2020-04-26 ENCOUNTER — Encounter: Payer: Self-pay | Admitting: Family Medicine

## 2020-05-22 ENCOUNTER — Ambulatory Visit (INDEPENDENT_AMBULATORY_CARE_PROVIDER_SITE_OTHER): Payer: 59 | Admitting: Otolaryngology

## 2020-06-02 ENCOUNTER — Ambulatory Visit (INDEPENDENT_AMBULATORY_CARE_PROVIDER_SITE_OTHER): Payer: 59 | Admitting: Family Medicine

## 2020-06-02 ENCOUNTER — Encounter: Payer: Self-pay | Admitting: Family Medicine

## 2020-06-02 ENCOUNTER — Other Ambulatory Visit: Payer: Self-pay

## 2020-06-02 VITALS — BP 106/70 | HR 89 | Temp 97.7°F | Ht 66.0 in | Wt 157.0 lb

## 2020-06-02 DIAGNOSIS — R232 Flushing: Secondary | ICD-10-CM | POA: Diagnosis not present

## 2020-06-02 DIAGNOSIS — E559 Vitamin D deficiency, unspecified: Secondary | ICD-10-CM | POA: Diagnosis not present

## 2020-06-02 DIAGNOSIS — N921 Excessive and frequent menstruation with irregular cycle: Secondary | ICD-10-CM

## 2020-06-02 DIAGNOSIS — D509 Iron deficiency anemia, unspecified: Secondary | ICD-10-CM | POA: Diagnosis not present

## 2020-06-02 NOTE — Progress Notes (Signed)
This visit occurred during the SARS-CoV-2 public health emergency.  Safety protocols were in place, including screening questions prior to the visit, additional usage of staff PPE, and extensive cleaning of exam room while observing appropriate contact time as indicated for disinfecting solutions.    Mallory Green , 1970-07-07, 50 y.o., female MRN: [id number] Patient Care Team    Relationship Specialty Notifications Start End  Natalia Leatherwood, DO PCP - General Family Medicine  08/16/16   Maxie Better, MD Consulting Physician Obstetrics and Gynecology  12/30/18     Chief Complaint  Patient presents with  . Hot Flashes    Pt is fasting  . Knee Pain     Subjective: Pt presents for an OV with complaints of  Hot flashes Pt reports she did not start the celexa, but did start drinking Moringa tea from her home country of [place].  She reports it has resolved her hot flashes.  Prior note Patient had hot flashes since early 2021.  Her menstrual cycles are irregular skipping a few months at a time.  Her thyroid panel has been normal.  She has a family history of breast cancer.  She would like to try medication to help with her hot flashes that is not estrogen derived.  Acute pain of left knee She reports her knee pain has improved. Still mild tenderness from time to time, but she is walking daily.  Prior note:  Patient reports a few months ago she squatted down and on standing she felt a pain.  She remembers it being painful and walking with a little bit of a limp for a couple of days but nothing that was concerning to her.  Now she notices with certain movements which she cannot put her finger on she will receive a sharp pain in her left medial knee that only lasts a few seconds.  This does not occur daily.  She has had no prior knee surgery or injuries in the past.  Depression screen [facility] 2/9 01/12/2020 12/30/2018 08/18/2017 08/16/2016  Decreased Interest 0 0 0 0  Down, Depressed,  Hopeless 0 0 0 0  PHQ - 2 Score 0 0 0 0    No Known Allergies Social History   Social History Narrative   Married, 3 children    MSRN.    Drinks caffeine. Uses herbal remedies. Takes a daily vitamin.   Wears her seatbelt, wears a bicycle helmet, smoke detectors in the home.   Safe in her relationships.   Past Medical History:  Diagnosis Date  . Menorrhagia    Past Surgical History:  Procedure Laterality Date  . Disc surgery L4  2008   Family History  Problem Relation Age of Onset  . Hypertension Father   . Diabetes Father   . Diabetes Mother   . Heart disease Mother   . Breast cancer Mother 63  . Breast cancer Sister 74   Allergies as of 06/02/2020   No Known Allergies     Medication List       Accurate as of Jun 02, 2020  8:35 AM. If you have any questions, ask your nurse or doctor.        citalopram 10 MG tablet Commonly known as: CELEXA Take 1 tablet (10 mg total) by mouth daily.   iron polysaccharides 150 MG capsule Commonly known as: NIFEREX Take 1 capsule (150 mg total) by mouth daily.   MULTIVITAMIN ADULT PO Take by mouth.   omeprazole 40 MG  capsule Commonly known as: PRILOSEC Take 40 mg by mouth daily.   vitamin C 100 MG tablet Take 100 mg by mouth daily.   Vitamin D (Cholecalciferol) 25 MCG (1000 UT) Caps Take by mouth.   Zinc 25 MG Tabs Take by mouth.       All past medical history, surgical history, allergies, family history, immunizations andmedications were updated in the EMR today and reviewed under the history and medication portions of their EMR.     ROS: Negative, with the exception of above mentioned in HPI   Objective:  BP 106/70   Pulse 89   Temp 97.7 F (36.5 C) (Oral)   Ht 5\' 6"  (1.676 m)   Wt 157 lb (71.2 kg)   SpO2 96%   BMI 25.34 kg/m  Body mass index is 25.34 kg/m. Gen: Afebrile. No acute distress.  HENT: AT. [place].  Eyes:Pupils Equal Round Reactive to light, Extraocular movements intact,  Conjunctiva  without redness, discharge or icterus. MSK: no erythema, no soft tissue swelling. Very mild TTP left medial joint line. FROM.  Neuro: Normal gait. PERLA. EOMi. Alert. Orientedx3 Psych: Normal affect, dress and demeanor. Normal speech. Normal thought content and judgment.   No exam data present No results found. No results found for this or any previous visit (from the past 24 hour(s)).  Assessment/Plan: Mallory Green is a 50 y.o. female present for OV for follow up on 2 acute conditions Hot flashes Resolved w/ Moringa tea. Discussed safety profile of moringa tea (authentic from 54) is very good, and appears to be safe in studies.    Acute pain of left knee Improving, still mild discomfort.  Moringa tea may be helping her knee as well, since it has some natural anti-inflammatory properties.  She understands to proceed with caution on her exercise regimen. Wear neoprene sleeve with exercise and try to avoid twisting motion at knee, until completely healed.  Follow up PRN if pain returns or worsens Reviewed expectations re: course of current medical issues.  Discussed self-management of symptoms.  Outlined signs and symptoms indicating need for more acute intervention.  Patient verbalized understanding and all questions were answered.  Patient received an After-Visit Summary.    No orders of the defined types were placed in this encounter.  No orders of the defined types were placed in this encounter.  Referral Orders  No referral(s) requested today     Note is dictated utilizing voice recognition software. Although note has been proof read prior to signing, occasional typographical errors still can be missed. If any questions arise, please do not hesitate to call for verification.   electronically signed by:  [place], DO  [facility] [place]

## 2020-06-02 NOTE — Patient Instructions (Addendum)
[facility] Address: [address] [place], [place], [place] [address] Phone: [phone]   Great to see you today.

## 2020-07-17 ENCOUNTER — Ambulatory Visit: Payer: 59

## 2020-12-25 IMAGING — MG DIGITAL SCREENING BILAT W/ TOMO W/ CAD
8 series · 9 of 24 positions shown · non-contrast
Comparison: Previous exam(s).

CLINICAL DATA: Screening.

EXAM:
DIGITAL SCREENING BILATERAL MAMMOGRAM WITH TOMO AND CAD

[L CC synth-2D]
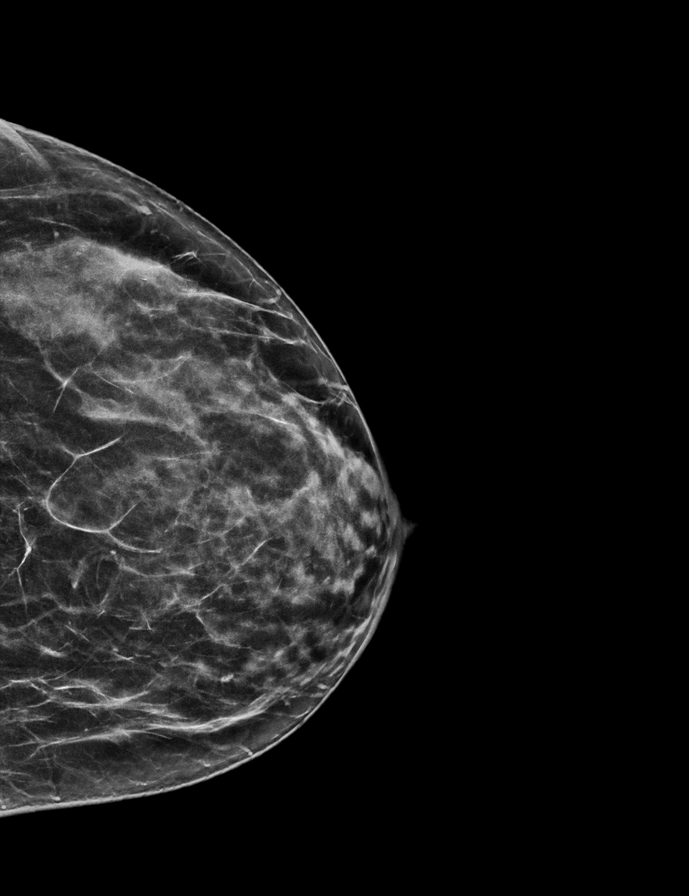

[R MLO synth-2D]
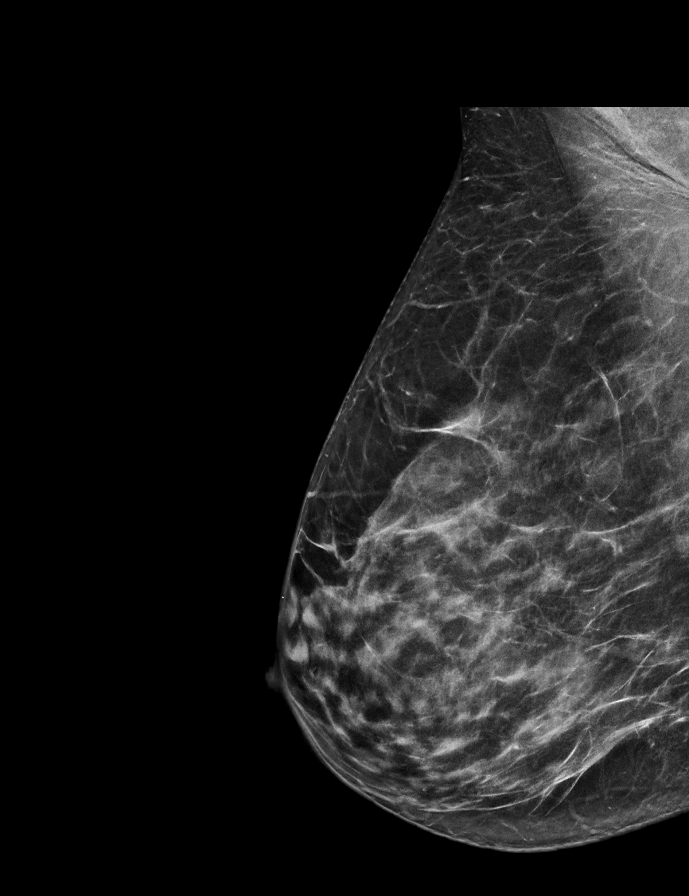

[L MLO synth-2D]
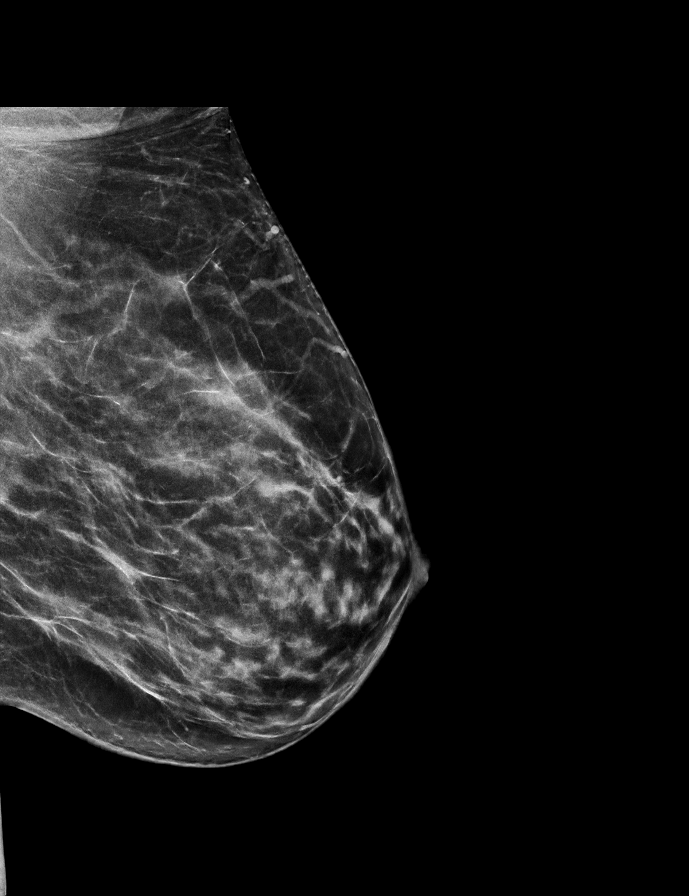

[R CC synth-2D]
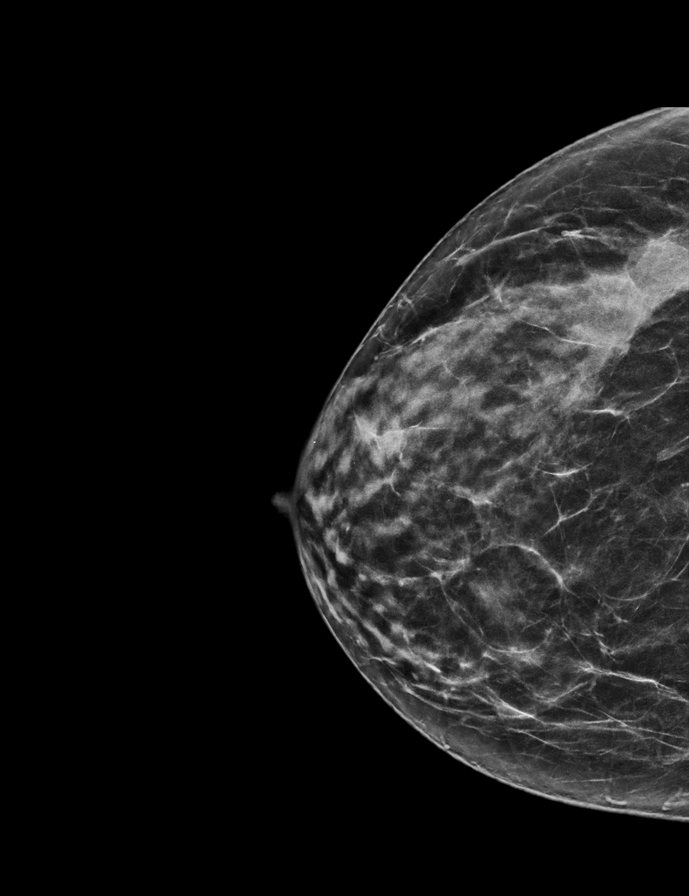

[L CC tomo · 2 of 57 frames shown]
[frame 19/57]
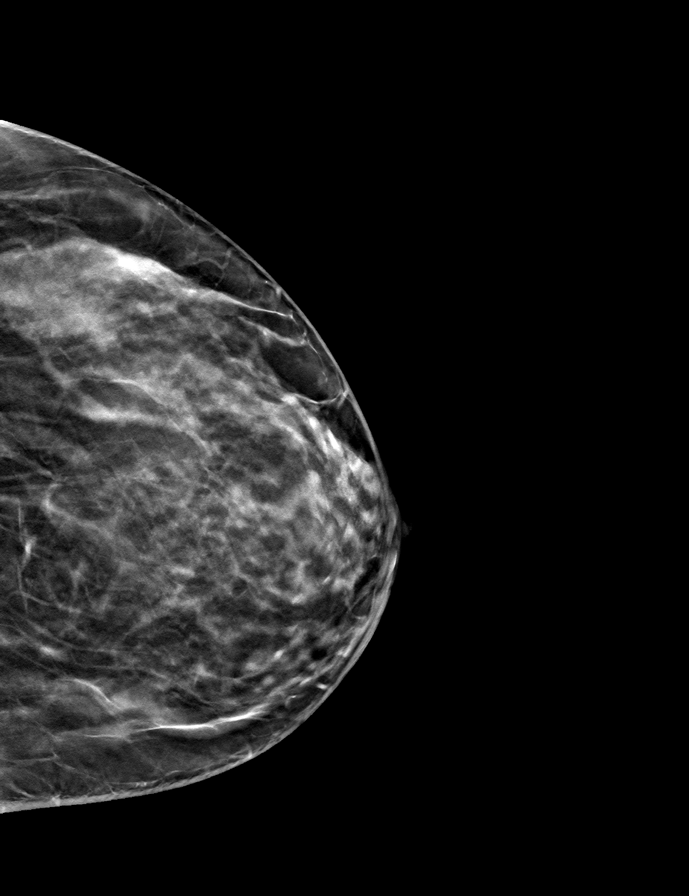
[frame 29/57]
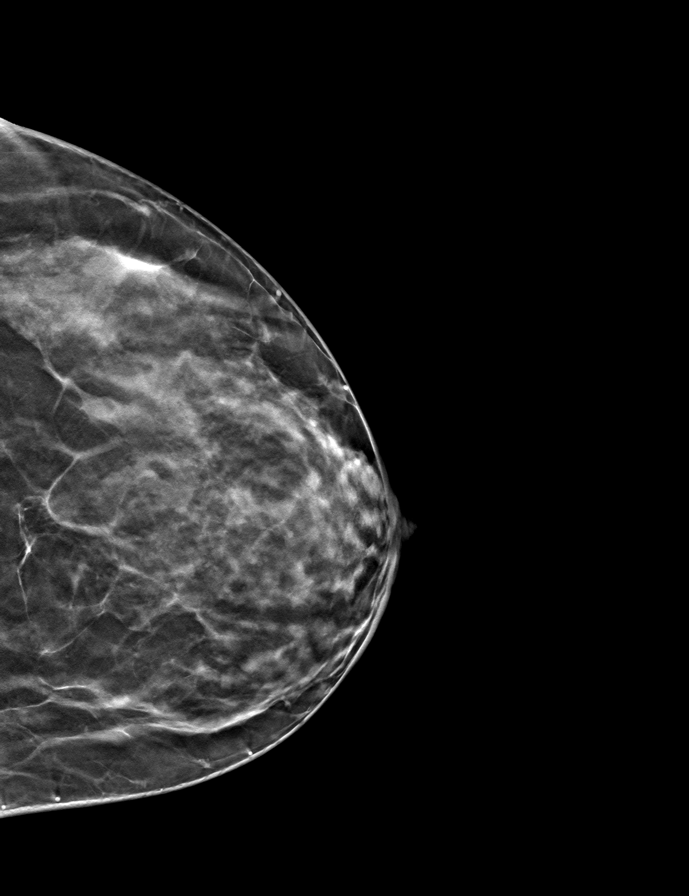

[R CC tomo · tomo slice 29/57.0]
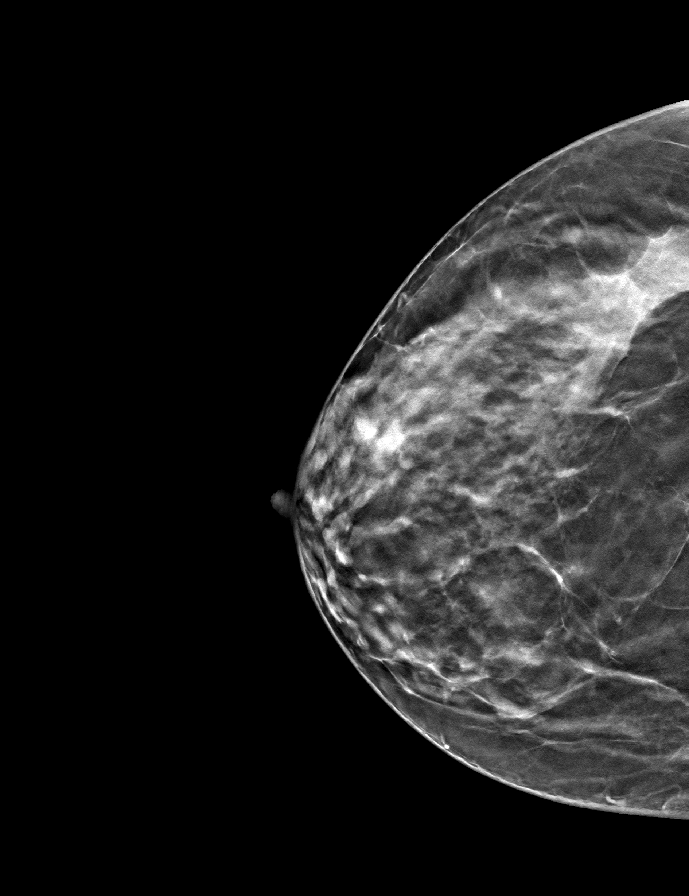

[L MLO tomo · tomo slice 32/63.0]
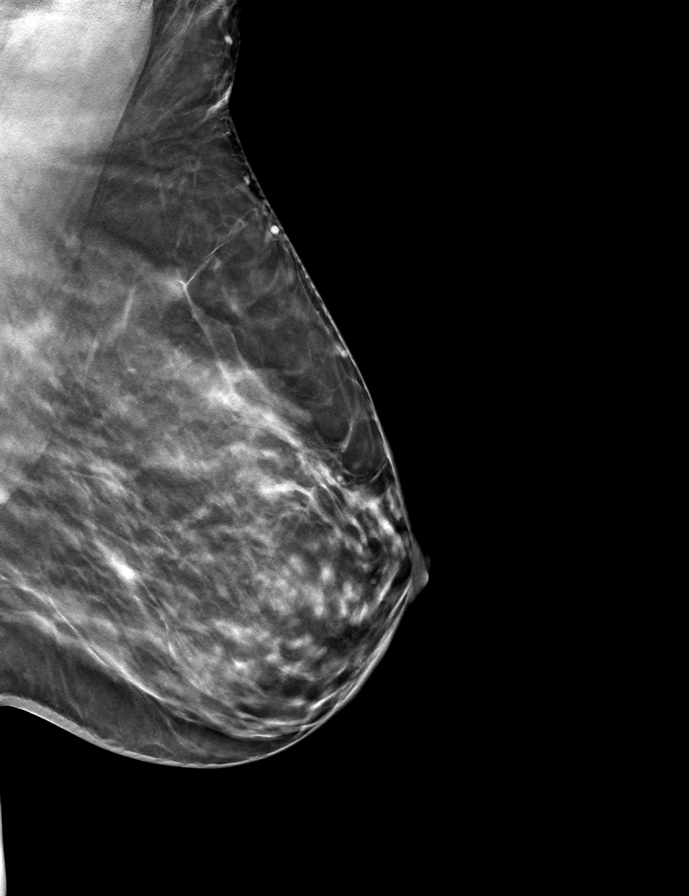

[R MLO tomo · tomo slice 32/63.0]
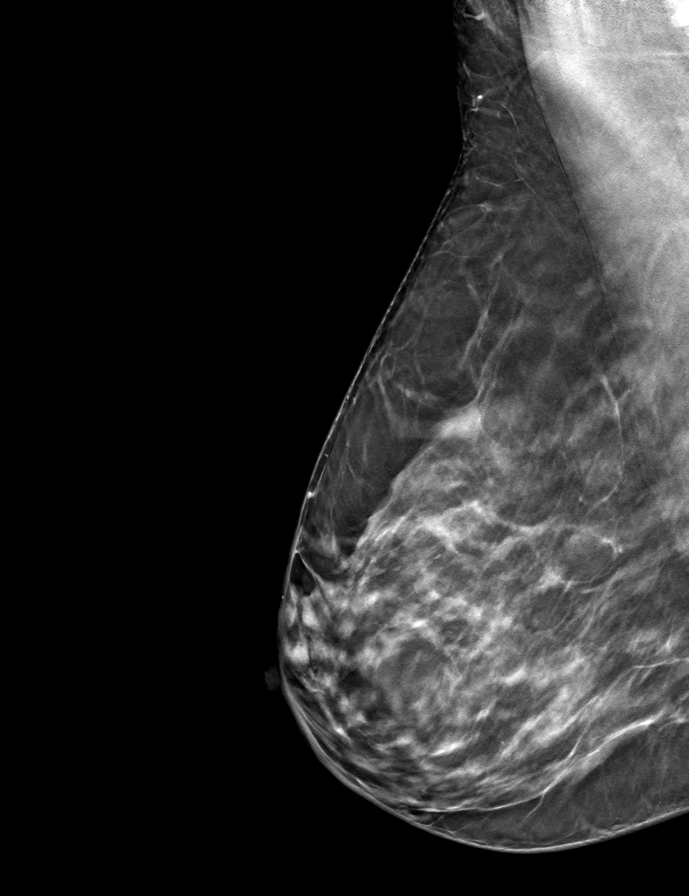

[9 of 24 positions shown; findings below may reference images not displayed]

ACR Breast Density Category c: The breast tissue is heterogeneously
dense, which may obscure small masses.
FINDINGS: There are no findings suspicious for malignancy. Images were
processed with CAD.
IMPRESSION: No mammographic evidence of malignancy. A result letter of this
screening mammogram will be mailed directly to the patient.

RECOMMENDATION:
Screening mammogram in one year. (Code:[id number])

BI-RADS CATEGORY  1: Negative.

## 2021-01-16 ENCOUNTER — Encounter: Payer: 59 | Admitting: Family Medicine

## 2021-01-17 ENCOUNTER — Encounter: Payer: Self-pay | Admitting: Family Medicine

## 2022-01-01 ENCOUNTER — Other Ambulatory Visit (HOSPITAL_BASED_OUTPATIENT_CLINIC_OR_DEPARTMENT_OTHER): Payer: Self-pay | Admitting: Family Medicine

## 2022-01-01 DIAGNOSIS — Z1231 Encounter for screening mammogram for malignant neoplasm of breast: Secondary | ICD-10-CM

## 2022-01-08 ENCOUNTER — Encounter (HOSPITAL_BASED_OUTPATIENT_CLINIC_OR_DEPARTMENT_OTHER): Payer: Self-pay

## 2022-01-08 ENCOUNTER — Ambulatory Visit (HOSPITAL_BASED_OUTPATIENT_CLINIC_OR_DEPARTMENT_OTHER)
Admission: RE | Admit: 2022-01-08 | Discharge: 2022-01-08 | Disposition: A | Discharge status: Home / Self Care | Payer: PRIVATE HEALTH INSURANCE | Source: Ambulatory Visit | Attending: Family Medicine | Admitting: Family Medicine

## 2022-01-08 DIAGNOSIS — Z1231 Encounter for screening mammogram for malignant neoplasm of breast: Secondary | ICD-10-CM | POA: Insufficient documentation
